# Patient Record
Sex: Female | Born: 1945 | Race: White | Hispanic: No | Smoking: Never smoker
Health system: Southern US, Community
[De-identification: ages and names within clinical notes are randomized; demographics above are authoritative.]

## PROBLEM LIST (undated history)

## (undated) DIAGNOSIS — A809 Acute poliomyelitis, unspecified: Secondary | ICD-10-CM

## (undated) DIAGNOSIS — N189 Chronic kidney disease, unspecified: Secondary | ICD-10-CM

## (undated) DIAGNOSIS — E785 Hyperlipidemia, unspecified: Secondary | ICD-10-CM

## (undated) DIAGNOSIS — H269 Unspecified cataract: Secondary | ICD-10-CM

## (undated) DIAGNOSIS — T7840XA Allergy, unspecified, initial encounter: Secondary | ICD-10-CM

## (undated) DIAGNOSIS — Z87898 Personal history of other specified conditions: Secondary | ICD-10-CM

## (undated) DIAGNOSIS — F419 Anxiety disorder, unspecified: Secondary | ICD-10-CM

## (undated) DIAGNOSIS — M810 Age-related osteoporosis without current pathological fracture: Secondary | ICD-10-CM

## (undated) DIAGNOSIS — I10 Essential (primary) hypertension: Secondary | ICD-10-CM

## (undated) DIAGNOSIS — J45909 Unspecified asthma, uncomplicated: Secondary | ICD-10-CM

## (undated) HISTORY — DX: Unspecified asthma, uncomplicated: J45.909

## (undated) HISTORY — DX: Age-related osteoporosis without current pathological fracture: M81.0

## (undated) HISTORY — DX: Hyperlipidemia, unspecified: E78.5

## (undated) HISTORY — DX: Anxiety disorder, unspecified: F41.9

## (undated) HISTORY — DX: Essential (primary) hypertension: I10

## (undated) HISTORY — DX: Acute poliomyelitis, unspecified: A80.9

## (undated) HISTORY — PX: BREAST SURGERY: SHX581

## (undated) HISTORY — DX: Chronic kidney disease, unspecified: N18.9

## (undated) HISTORY — DX: Personal history of other specified conditions: Z87.898

## (undated) HISTORY — PX: COLONOSCOPY: SHX174

## (undated) HISTORY — PX: DILATION AND CURETTAGE OF UTERUS: SHX78

## (undated) HISTORY — PX: POLYPECTOMY: SHX149

## (undated) HISTORY — PX: BREAST EXCISIONAL BIOPSY: SUR124

## (undated) HISTORY — DX: Allergy, unspecified, initial encounter: T78.40XA

## (undated) HISTORY — PX: CATARACT EXTRACTION, BILATERAL: SHX1313

## (undated) HISTORY — PX: CERVICAL BIOPSY: SHX590

---
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
## 1989-02-20 HISTORY — PX: OTHER SURGICAL HISTORY: SHX169

## 2007-10-25 ENCOUNTER — Ambulatory Visit: Payer: Self-pay | Admitting: Gastroenterology

## 2007-11-08 ENCOUNTER — Ambulatory Visit: Payer: Self-pay | Admitting: Gastroenterology

## 2007-11-08 ENCOUNTER — Encounter: Payer: Self-pay | Admitting: Gastroenterology

## 2007-11-12 ENCOUNTER — Encounter: Payer: Self-pay | Admitting: Gastroenterology

## 2008-08-05 ENCOUNTER — Encounter: Admission: RE | Admit: 2008-08-05 | Discharge: 2008-08-05 | Payer: Self-pay | Admitting: Family Medicine

## 2009-08-27 ENCOUNTER — Encounter: Admission: RE | Admit: 2009-08-27 | Discharge: 2009-08-27 | Payer: Self-pay | Admitting: Family Medicine

## 2010-09-05 ENCOUNTER — Other Ambulatory Visit: Payer: Self-pay | Admitting: Family Medicine

## 2010-09-05 DIAGNOSIS — Z1231 Encounter for screening mammogram for malignant neoplasm of breast: Secondary | ICD-10-CM

## 2010-09-15 ENCOUNTER — Ambulatory Visit
Admission: RE | Admit: 2010-09-15 | Discharge: 2010-09-15 | Disposition: A | Discharge status: Home / Self Care | Payer: Medicare Other | Source: Ambulatory Visit | Attending: Family Medicine | Admitting: Family Medicine

## 2010-09-15 DIAGNOSIS — Z1231 Encounter for screening mammogram for malignant neoplasm of breast: Secondary | ICD-10-CM

## 2011-06-01 DIAGNOSIS — E785 Hyperlipidemia, unspecified: Secondary | ICD-10-CM | POA: Diagnosis not present

## 2011-06-01 DIAGNOSIS — I10 Essential (primary) hypertension: Secondary | ICD-10-CM | POA: Diagnosis not present

## 2011-06-01 DIAGNOSIS — Z79899 Other long term (current) drug therapy: Secondary | ICD-10-CM | POA: Diagnosis not present

## 2011-09-20 ENCOUNTER — Other Ambulatory Visit: Payer: Self-pay | Admitting: Family Medicine

## 2011-09-20 DIAGNOSIS — Z1231 Encounter for screening mammogram for malignant neoplasm of breast: Secondary | ICD-10-CM

## 2011-10-09 ENCOUNTER — Ambulatory Visit
Admission: RE | Admit: 2011-10-09 | Discharge: 2011-10-09 | Disposition: A | Discharge status: Home / Self Care | Payer: Medicare Other | Source: Ambulatory Visit | Attending: Family Medicine | Admitting: Family Medicine

## 2011-10-09 DIAGNOSIS — Z1231 Encounter for screening mammogram for malignant neoplasm of breast: Secondary | ICD-10-CM | POA: Diagnosis not present

## 2011-10-11 ENCOUNTER — Ambulatory Visit: Payer: Medicare Other

## 2011-11-14 DIAGNOSIS — H5210 Myopia, unspecified eye: Secondary | ICD-10-CM | POA: Diagnosis not present

## 2011-11-14 DIAGNOSIS — H43819 Vitreous degeneration, unspecified eye: Secondary | ICD-10-CM | POA: Diagnosis not present

## 2011-12-04 DIAGNOSIS — Z79899 Other long term (current) drug therapy: Secondary | ICD-10-CM | POA: Diagnosis not present

## 2011-12-04 DIAGNOSIS — E785 Hyperlipidemia, unspecified: Secondary | ICD-10-CM | POA: Diagnosis not present

## 2011-12-04 DIAGNOSIS — J3089 Other allergic rhinitis: Secondary | ICD-10-CM | POA: Diagnosis not present

## 2011-12-04 DIAGNOSIS — I10 Essential (primary) hypertension: Secondary | ICD-10-CM | POA: Diagnosis not present

## 2011-12-04 DIAGNOSIS — J45909 Unspecified asthma, uncomplicated: Secondary | ICD-10-CM | POA: Diagnosis not present

## 2011-12-05 DIAGNOSIS — Z23 Encounter for immunization: Secondary | ICD-10-CM | POA: Diagnosis not present

## 2012-01-22 DIAGNOSIS — E785 Hyperlipidemia, unspecified: Secondary | ICD-10-CM | POA: Diagnosis not present

## 2012-08-15 DIAGNOSIS — Z79899 Other long term (current) drug therapy: Secondary | ICD-10-CM | POA: Diagnosis not present

## 2012-08-15 DIAGNOSIS — I10 Essential (primary) hypertension: Secondary | ICD-10-CM | POA: Diagnosis not present

## 2012-08-15 DIAGNOSIS — J3089 Other allergic rhinitis: Secondary | ICD-10-CM | POA: Diagnosis not present

## 2012-08-15 DIAGNOSIS — E785 Hyperlipidemia, unspecified: Secondary | ICD-10-CM | POA: Diagnosis not present

## 2012-08-15 DIAGNOSIS — J45909 Unspecified asthma, uncomplicated: Secondary | ICD-10-CM | POA: Diagnosis not present

## 2012-09-03 ENCOUNTER — Encounter: Payer: Self-pay | Admitting: Gastroenterology

## 2012-10-17 ENCOUNTER — Other Ambulatory Visit: Payer: Self-pay

## 2012-10-17 DIAGNOSIS — Z1231 Encounter for screening mammogram for malignant neoplasm of breast: Secondary | ICD-10-CM

## 2012-10-24 ENCOUNTER — Ambulatory Visit: Payer: Medicare Other

## 2012-11-15 ENCOUNTER — Ambulatory Visit
Admission: RE | Admit: 2012-11-15 | Discharge: 2012-11-15 | Disposition: A | Discharge status: Home / Self Care | Payer: Medicare Other | Source: Ambulatory Visit

## 2012-11-15 DIAGNOSIS — Z1231 Encounter for screening mammogram for malignant neoplasm of breast: Secondary | ICD-10-CM

## 2012-12-06 DIAGNOSIS — F329 Major depressive disorder, single episode, unspecified: Secondary | ICD-10-CM | POA: Diagnosis not present

## 2012-12-06 DIAGNOSIS — J309 Allergic rhinitis, unspecified: Secondary | ICD-10-CM | POA: Diagnosis not present

## 2012-12-06 DIAGNOSIS — Z23 Encounter for immunization: Secondary | ICD-10-CM | POA: Diagnosis not present

## 2012-12-06 DIAGNOSIS — E7800 Pure hypercholesterolemia, unspecified: Secondary | ICD-10-CM | POA: Diagnosis not present

## 2012-12-06 DIAGNOSIS — I10 Essential (primary) hypertension: Secondary | ICD-10-CM | POA: Diagnosis not present

## 2012-12-06 DIAGNOSIS — M1990 Unspecified osteoarthritis, unspecified site: Secondary | ICD-10-CM | POA: Diagnosis not present

## 2012-12-11 DIAGNOSIS — E7800 Pure hypercholesterolemia, unspecified: Secondary | ICD-10-CM | POA: Diagnosis not present

## 2012-12-11 DIAGNOSIS — I10 Essential (primary) hypertension: Secondary | ICD-10-CM | POA: Diagnosis not present

## 2013-01-09 DIAGNOSIS — Z23 Encounter for immunization: Secondary | ICD-10-CM | POA: Diagnosis not present

## 2013-01-09 DIAGNOSIS — Z1289 Encounter for screening for malignant neoplasm of other sites: Secondary | ICD-10-CM | POA: Diagnosis not present

## 2013-01-09 DIAGNOSIS — Z0000 Encounter for general adult medical examination without abnormal findings: Secondary | ICD-10-CM | POA: Diagnosis not present

## 2013-01-09 DIAGNOSIS — Z124 Encounter for screening for malignant neoplasm of cervix: Secondary | ICD-10-CM | POA: Diagnosis not present

## 2013-03-21 DIAGNOSIS — E7800 Pure hypercholesterolemia, unspecified: Secondary | ICD-10-CM | POA: Diagnosis not present

## 2013-03-21 DIAGNOSIS — I10 Essential (primary) hypertension: Secondary | ICD-10-CM | POA: Diagnosis not present

## 2013-03-21 DIAGNOSIS — M1990 Unspecified osteoarthritis, unspecified site: Secondary | ICD-10-CM | POA: Diagnosis not present

## 2013-03-21 DIAGNOSIS — Z13820 Encounter for screening for osteoporosis: Secondary | ICD-10-CM | POA: Diagnosis not present

## 2013-03-21 DIAGNOSIS — Z79899 Other long term (current) drug therapy: Secondary | ICD-10-CM | POA: Diagnosis not present

## 2013-03-31 DIAGNOSIS — M949 Disorder of cartilage, unspecified: Secondary | ICD-10-CM | POA: Diagnosis not present

## 2013-03-31 DIAGNOSIS — Z13820 Encounter for screening for osteoporosis: Secondary | ICD-10-CM | POA: Diagnosis not present

## 2013-03-31 DIAGNOSIS — M899 Disorder of bone, unspecified: Secondary | ICD-10-CM | POA: Diagnosis not present

## 2013-06-10 ENCOUNTER — Encounter: Payer: Self-pay | Admitting: Gastroenterology

## 2013-06-27 DIAGNOSIS — M1990 Unspecified osteoarthritis, unspecified site: Secondary | ICD-10-CM | POA: Diagnosis not present

## 2013-06-27 DIAGNOSIS — J309 Allergic rhinitis, unspecified: Secondary | ICD-10-CM | POA: Diagnosis not present

## 2013-06-27 DIAGNOSIS — I10 Essential (primary) hypertension: Secondary | ICD-10-CM | POA: Diagnosis not present

## 2013-06-27 DIAGNOSIS — H6120 Impacted cerumen, unspecified ear: Secondary | ICD-10-CM | POA: Diagnosis not present

## 2013-06-27 DIAGNOSIS — E7800 Pure hypercholesterolemia, unspecified: Secondary | ICD-10-CM | POA: Diagnosis not present

## 2013-08-04 ENCOUNTER — Telehealth: Payer: Self-pay | Admitting: Gastroenterology

## 2013-08-04 DIAGNOSIS — Z1211 Encounter for screening for malignant neoplasm of colon: Secondary | ICD-10-CM

## 2013-08-04 NOTE — Telephone Encounter (Signed)
Spoke with patient and she had Moviprep last time and would like this again. Scheduled with Dr. Hilarie Fredrickson on 10/13/13 at 11:00 AM and previsit on 10/03/13 at 9:00 AM.

## 2013-10-03 ENCOUNTER — Ambulatory Visit (AMBULATORY_SURGERY_CENTER): Payer: Self-pay

## 2013-10-03 VITALS — Ht 64.0 in | Wt 131.0 lb

## 2013-10-03 DIAGNOSIS — Z86010 Personal history of colonic polyps: Secondary | ICD-10-CM

## 2013-10-03 MED ORDER — MOVIPREP 100 G PO SOLR: ORAL | Status: DC

## 2013-10-03 NOTE — Progress Notes (Signed)
Per pt, no allergies to soy or egg products.Pt not taking any weight loss meds or using  O2 at home.     Per pt ",hard to wake up past some sedations!Michelle Whitaker"

## 2013-10-08 DIAGNOSIS — R7301 Impaired fasting glucose: Secondary | ICD-10-CM | POA: Diagnosis not present

## 2013-10-08 DIAGNOSIS — M1990 Unspecified osteoarthritis, unspecified site: Secondary | ICD-10-CM | POA: Diagnosis not present

## 2013-10-08 DIAGNOSIS — J309 Allergic rhinitis, unspecified: Secondary | ICD-10-CM | POA: Diagnosis not present

## 2013-10-08 DIAGNOSIS — Z23 Encounter for immunization: Secondary | ICD-10-CM | POA: Diagnosis not present

## 2013-10-08 DIAGNOSIS — I10 Essential (primary) hypertension: Secondary | ICD-10-CM | POA: Diagnosis not present

## 2013-10-08 DIAGNOSIS — E7800 Pure hypercholesterolemia, unspecified: Secondary | ICD-10-CM | POA: Diagnosis not present

## 2013-10-10 ENCOUNTER — Telehealth: Payer: Self-pay | Admitting: *Deleted

## 2013-10-10 NOTE — Telephone Encounter (Signed)
Patient requesting moderate sedation instead of Propofol, states she is very sensitive to medications and has asthma, and she did well with moderate last time with Dr. Sharlett Iles. Further conversation with her and she explains she is just nervous, lost her husband last year and doesn't do well with procedures. I spoke with her extensively about Propofol and Versed/Fentanyl. Also spoke with J. Monday, CRNA regarding patient concerns. Patient a bit more reassured but also informed her that she would get the opportunity to discuss her concerns and her wishes with both the CRNA and Dr. Hilarie Fredrickson Monday prior to her procedure.

## 2013-10-10 NOTE — Telephone Encounter (Signed)
Patient calls and phone call was transferred directly to admitting in [facility].  She is requesting Fentanyl and Versed not Propoful because she states that she has Asthma and has never been put to sleep with any procedures.  I gave call to Margaretmary Eddy, RN. and she spoke with patient regarding this.

## 2013-10-13 ENCOUNTER — Other Ambulatory Visit: Payer: Self-pay | Admitting: Internal Medicine

## 2013-10-13 ENCOUNTER — Ambulatory Visit (AMBULATORY_SURGERY_CENTER): Payer: Medicare Other | Admitting: Internal Medicine

## 2013-10-13 ENCOUNTER — Encounter: Payer: Self-pay | Admitting: Internal Medicine

## 2013-10-13 VITALS — BP 148/83 | HR 66 | Temp 98.3°F | Resp 16 | Ht 64.0 in | Wt 131.0 lb

## 2013-10-13 DIAGNOSIS — R002 Palpitations: Secondary | ICD-10-CM | POA: Diagnosis not present

## 2013-10-13 DIAGNOSIS — Z86010 Personal history of colonic polyps: Secondary | ICD-10-CM | POA: Diagnosis not present

## 2013-10-13 DIAGNOSIS — I10 Essential (primary) hypertension: Secondary | ICD-10-CM | POA: Diagnosis not present

## 2013-10-13 DIAGNOSIS — J45909 Unspecified asthma, uncomplicated: Secondary | ICD-10-CM | POA: Diagnosis not present

## 2013-10-13 DIAGNOSIS — D126 Benign neoplasm of colon, unspecified: Secondary | ICD-10-CM

## 2013-10-13 MED ORDER — SODIUM CHLORIDE 0.9 % IV SOLN: 500.0000 mL | INTRAVENOUS | Status: DC

## 2013-10-13 NOTE — Patient Instructions (Signed)
YOU HAD AN ENDOSCOPIC PROCEDURE TODAY AT THE [facility]: Refer to the procedure report that was given to you for any specific questions about what was found during the examination.  If the procedure report does not answer your questions, please call your gastroenterologist to clarify.  If you requested that your care partner not be given the details of your procedure findings, then the procedure report has been included in a sealed envelope for you to review at your convenience later.  YOU SHOULD EXPECT: Some feelings of bloating in the abdomen. Passage of more gas than usual.  Walking can help get rid of the air that was put into your GI tract during the procedure and reduce the bloating. If you had a lower endoscopy (such as a colonoscopy or flexible sigmoidoscopy) you may notice spotting of blood in your stool or on the toilet paper. If you underwent a bowel prep for your procedure, then you may not have a normal bowel movement for a few days.  DIET: Your first meal following the procedure should be a light meal and then it is ok to progress to your normal diet.  A half-sandwich or bowl of soup is an example of a good first meal.  Heavy or fried foods are harder to digest and may make you feel nauseous or bloated.  Likewise meals heavy in dairy and vegetables can cause extra gas to form and this can also increase the bloating.  Drink plenty of fluids but you should avoid alcoholic beverages for 24 hours.  ACTIVITY: Your care partner should take you home directly after the procedure.  You should plan to take it easy, moving slowly for the rest of the day.  You can resume normal activity the day after the procedure however you should NOT DRIVE or use heavy machinery for 24 hours (because of the sedation medicines used during the test).    SYMPTOMS TO REPORT IMMEDIATELY: A gastroenterologist can be reached at any hour.  During normal business hours, 8:30 AM to 5:00 PM Monday through Friday,  call [phone].  After hours and on weekends, please call the GI answering service at [phone] who will take a message and have the physician on call contact you.   Following lower endoscopy (colonoscopy or flexible sigmoidoscopy):  Excessive amounts of blood in the stool  Significant tenderness or worsening of abdominal pains  Swelling of the abdomen that is new, acute  Fever of 100F or higher    FOLLOW UP: If any biopsies were taken you will be contacted by phone or by letter within the next 1-3 weeks.  Call your gastroenterologist if you have not heard about the biopsies in 3 weeks.  Our staff will call the home number listed on your records the next business day following your procedure to check on you and address any questions or concerns that you may have at that time regarding the information given to you following your procedure. This is a courtesy call and so if there is no answer at the home number and we have not heard from you through the emergency physician on call, we will assume that you have returned to your regular daily activities without incident.  SIGNATURES/CONFIDENTIALITY: You and/or your care partner have signed paperwork which will be entered into your electronic medical record.  These signatures attest to the fact that that the information above on your After Visit Summary has been reviewed and is understood.  Full responsibility of the confidentiality   of this discharge information lies with you and/or your care-partner.  Polyp, diverticulosis, and high fiber diet information given. 

## 2013-10-13 NOTE — Op Note (Signed)
[place]  [address] [place], [address]   COLONOSCOPY PROCEDURE REPORT  PATIENT: Michelle, Whitaker  MR#: [id number] BIRTHDATE: [birth year] , 55  yrs. old GENDER: Female ENDOSCOPIST: Jerene Bears, MD PROCEDURE DATE:  10/13/2013 PROCEDURE:   Colonoscopy with snare polypectomy First Screening Colonoscopy - Avg.  risk and is 50 yrs.  old or older - No.  Prior Negative Screening - Now for repeat screening. N/A  History of Adenoma - Now for follow-up colonoscopy & has been > or = to 3 yrs.  Yes hx of adenoma.  Has been 3 or more years since last colonoscopy.  Polyps Removed Today? Yes. ASA CLASS:   Class II INDICATIONS:elevated risk screening, Patient's personal history of adenomatous colon polyps, and Last colonoscopy performed 2009 Sharlett Iles). MEDICATIONS: MAC sedation, administered by CRNA and propofol (Diprivan) 230mg  IV  DESCRIPTION OF PROCEDURE:   After the risks benefits and alternatives of the procedure were thoroughly explained, informed consent was obtained.  A digital rectal exam revealed no rectal mass.   The LB [id number]-[id number]  endoscope was introduced through the anus and advanced to the cecum, which was identified by both the appendix and ileocecal valve. No adverse events experienced. The quality of the prep was good, using MoviPrep  The instrument was then slowly withdrawn as the colon was fully examined.   COLON FINDINGS: A sessile polyp measuring 6 mm in size was found in the sigmoid colon.  A polypectomy was performed with a cold snare. The resection was complete and the polyp tissue was completely retrieved.   There was mild scattered diverticulosis noted in the sigmoid colon.  Retroflexed views revealed internal hemorrhoids. The time to cecum=4 minutes 50 seconds.  Withdrawal time=9 minutes 30 seconds.  The scope was withdrawn and the procedure completed. COMPLICATIONS: There were no complications.  ENDOSCOPIC IMPRESSION: 1.   Sessile  polyp measuring 6 mm in size was found in the sigmoid colon; polypectomy was performed with a cold snare 2.   There was mild diverticulosis noted in the sigmoid colon  RECOMMENDATIONS: 1.  Await pathology results 2.  If the polyp removed today is proven to be an adenomatous (pre-cancerous) polyp, you will need a repeat colonoscopy in 5 years.  Otherwise you should continue to follow colorectal cancer screening guidelines for "routine risk" patients with colonoscopy in 10 years.  You will receive a letter within 1-2 weeks with the results of your biopsy as well as final recommendations.  Please call my office if you have not received a letter after 3 weeks.   eSigned:  Jerene Bears, MD 10/13/2013 11:19 AM  cc: The Patient; [name], MD

## 2013-10-13 NOTE — Progress Notes (Signed)
Called to room to assist during endoscopic procedure.  Patient ID and intended procedure confirmed with present staff. Received instructions for my participation in the procedure from the performing physician.  

## 2013-10-13 NOTE — Progress Notes (Signed)
Report to PACU, RN, vss, BBS= Clear.  

## 2013-10-14 ENCOUNTER — Telehealth: Payer: Self-pay | Admitting: *Deleted

## 2013-10-14 NOTE — Telephone Encounter (Signed)
  Follow up Call-  Call back number 10/13/2013  Post procedure Call Back phone  # [phone]  Permission to leave phone message Yes     Patient questions:  Do you have a fever, pain , or abdominal swelling? No. Pain Score  0 *  Have you tolerated food without any problems? Yes.    Have you been able to return to your normal activities? Yes.    Do you have any questions about your discharge instructions: Diet   No. Medications  No. Follow up visit  No.  Do you have questions or concerns about your Care? No.  Actions: * If pain score is 4 or above: No action needed, pain <4.  Pt. Stated Thank Everyone for taking good care of me.

## 2013-10-20 ENCOUNTER — Encounter: Payer: Self-pay | Admitting: Internal Medicine

## 2013-11-20 DIAGNOSIS — Z23 Encounter for immunization: Secondary | ICD-10-CM | POA: Diagnosis not present

## 2013-11-27 ENCOUNTER — Other Ambulatory Visit: Payer: Self-pay

## 2013-11-27 DIAGNOSIS — Z1231 Encounter for screening mammogram for malignant neoplasm of breast: Secondary | ICD-10-CM

## 2013-12-19 ENCOUNTER — Ambulatory Visit: Payer: Medicare Other

## 2013-12-19 ENCOUNTER — Ambulatory Visit
Admission: RE | Admit: 2013-12-19 | Discharge: 2013-12-19 | Disposition: A | Discharge status: Home / Self Care | Payer: Medicare Other | Source: Ambulatory Visit

## 2013-12-19 DIAGNOSIS — Z1231 Encounter for screening mammogram for malignant neoplasm of breast: Secondary | ICD-10-CM

## 2014-01-20 DIAGNOSIS — E780 Pure hypercholesterolemia: Secondary | ICD-10-CM | POA: Diagnosis not present

## 2014-01-20 DIAGNOSIS — J301 Allergic rhinitis due to pollen: Secondary | ICD-10-CM | POA: Diagnosis not present

## 2014-01-20 DIAGNOSIS — M150 Primary generalized (osteo)arthritis: Secondary | ICD-10-CM | POA: Diagnosis not present

## 2014-01-20 DIAGNOSIS — R7301 Impaired fasting glucose: Secondary | ICD-10-CM | POA: Diagnosis not present

## 2014-01-20 DIAGNOSIS — I10 Essential (primary) hypertension: Secondary | ICD-10-CM | POA: Diagnosis not present

## 2014-01-21 DIAGNOSIS — Z23 Encounter for immunization: Secondary | ICD-10-CM | POA: Diagnosis not present

## 2014-02-03 DIAGNOSIS — H524 Presbyopia: Secondary | ICD-10-CM | POA: Diagnosis not present

## 2014-02-03 DIAGNOSIS — H268 Other specified cataract: Secondary | ICD-10-CM | POA: Diagnosis not present

## 2014-02-18 DIAGNOSIS — I10 Essential (primary) hypertension: Secondary | ICD-10-CM | POA: Diagnosis not present

## 2014-02-18 DIAGNOSIS — R002 Palpitations: Secondary | ICD-10-CM | POA: Diagnosis not present

## 2014-04-29 DIAGNOSIS — I10 Essential (primary) hypertension: Secondary | ICD-10-CM | POA: Diagnosis not present

## 2014-04-29 DIAGNOSIS — E780 Pure hypercholesterolemia: Secondary | ICD-10-CM | POA: Diagnosis not present

## 2014-04-29 DIAGNOSIS — R7301 Impaired fasting glucose: Secondary | ICD-10-CM | POA: Diagnosis not present

## 2014-04-29 DIAGNOSIS — J301 Allergic rhinitis due to pollen: Secondary | ICD-10-CM | POA: Diagnosis not present

## 2014-04-29 DIAGNOSIS — M150 Primary generalized (osteo)arthritis: Secondary | ICD-10-CM | POA: Diagnosis not present

## 2014-08-06 DIAGNOSIS — R319 Hematuria, unspecified: Secondary | ICD-10-CM | POA: Diagnosis not present

## 2014-08-06 DIAGNOSIS — Z23 Encounter for immunization: Secondary | ICD-10-CM | POA: Diagnosis not present

## 2014-08-06 DIAGNOSIS — Z0000 Encounter for general adult medical examination without abnormal findings: Secondary | ICD-10-CM | POA: Diagnosis not present

## 2014-08-06 DIAGNOSIS — Z1211 Encounter for screening for malignant neoplasm of colon: Secondary | ICD-10-CM | POA: Diagnosis not present

## 2014-08-06 DIAGNOSIS — E780 Pure hypercholesterolemia: Secondary | ICD-10-CM | POA: Diagnosis not present

## 2014-08-06 DIAGNOSIS — R7301 Impaired fasting glucose: Secondary | ICD-10-CM | POA: Diagnosis not present

## 2014-10-22 DIAGNOSIS — L255 Unspecified contact dermatitis due to plants, except food: Secondary | ICD-10-CM | POA: Diagnosis not present

## 2014-11-11 DIAGNOSIS — I10 Essential (primary) hypertension: Secondary | ICD-10-CM | POA: Diagnosis not present

## 2014-11-11 DIAGNOSIS — R7301 Impaired fasting glucose: Secondary | ICD-10-CM | POA: Diagnosis not present

## 2014-11-11 DIAGNOSIS — E780 Pure hypercholesterolemia: Secondary | ICD-10-CM | POA: Diagnosis not present

## 2014-11-16 DIAGNOSIS — J301 Allergic rhinitis due to pollen: Secondary | ICD-10-CM | POA: Diagnosis not present

## 2014-11-16 DIAGNOSIS — M150 Primary generalized (osteo)arthritis: Secondary | ICD-10-CM | POA: Diagnosis not present

## 2014-11-16 DIAGNOSIS — R7301 Impaired fasting glucose: Secondary | ICD-10-CM | POA: Diagnosis not present

## 2014-11-16 DIAGNOSIS — E780 Pure hypercholesterolemia: Secondary | ICD-10-CM | POA: Diagnosis not present

## 2014-11-16 DIAGNOSIS — I10 Essential (primary) hypertension: Secondary | ICD-10-CM | POA: Diagnosis not present

## 2014-11-16 DIAGNOSIS — Z23 Encounter for immunization: Secondary | ICD-10-CM | POA: Diagnosis not present

## 2014-12-08 ENCOUNTER — Other Ambulatory Visit: Payer: Self-pay

## 2014-12-08 DIAGNOSIS — Z1231 Encounter for screening mammogram for malignant neoplasm of breast: Secondary | ICD-10-CM

## 2014-12-25 ENCOUNTER — Ambulatory Visit
Admission: RE | Admit: 2014-12-25 | Discharge: 2014-12-25 | Disposition: A | Discharge status: Home / Self Care | Payer: Medicare Other | Source: Ambulatory Visit

## 2014-12-25 DIAGNOSIS — Z1231 Encounter for screening mammogram for malignant neoplasm of breast: Secondary | ICD-10-CM | POA: Diagnosis not present

## 2015-02-03 DIAGNOSIS — H25013 Cortical age-related cataract, bilateral: Secondary | ICD-10-CM | POA: Diagnosis not present

## 2015-02-03 DIAGNOSIS — H5213 Myopia, bilateral: Secondary | ICD-10-CM | POA: Diagnosis not present

## 2015-02-16 DIAGNOSIS — I10 Essential (primary) hypertension: Secondary | ICD-10-CM | POA: Diagnosis not present

## 2015-02-16 DIAGNOSIS — R7301 Impaired fasting glucose: Secondary | ICD-10-CM | POA: Diagnosis not present

## 2015-02-16 DIAGNOSIS — E782 Mixed hyperlipidemia: Secondary | ICD-10-CM | POA: Diagnosis not present

## 2015-02-16 DIAGNOSIS — Z79899 Other long term (current) drug therapy: Secondary | ICD-10-CM | POA: Diagnosis not present

## 2015-02-17 DIAGNOSIS — R7301 Impaired fasting glucose: Secondary | ICD-10-CM | POA: Diagnosis not present

## 2015-02-17 DIAGNOSIS — I10 Essential (primary) hypertension: Secondary | ICD-10-CM | POA: Diagnosis not present

## 2015-02-17 DIAGNOSIS — E782 Mixed hyperlipidemia: Secondary | ICD-10-CM | POA: Diagnosis not present

## 2015-05-17 DIAGNOSIS — J0101 Acute recurrent maxillary sinusitis: Secondary | ICD-10-CM | POA: Diagnosis not present

## 2015-08-17 DIAGNOSIS — E782 Mixed hyperlipidemia: Secondary | ICD-10-CM | POA: Diagnosis not present

## 2015-08-17 DIAGNOSIS — R7301 Impaired fasting glucose: Secondary | ICD-10-CM | POA: Diagnosis not present

## 2015-08-17 DIAGNOSIS — I10 Essential (primary) hypertension: Secondary | ICD-10-CM | POA: Diagnosis not present

## 2015-08-19 DIAGNOSIS — I10 Essential (primary) hypertension: Secondary | ICD-10-CM | POA: Diagnosis not present

## 2015-08-19 DIAGNOSIS — E782 Mixed hyperlipidemia: Secondary | ICD-10-CM | POA: Diagnosis not present

## 2015-08-19 DIAGNOSIS — R7301 Impaired fasting glucose: Secondary | ICD-10-CM | POA: Diagnosis not present

## 2015-10-18 ENCOUNTER — Other Ambulatory Visit: Payer: Self-pay

## 2015-11-16 DIAGNOSIS — Z0000 Encounter for general adult medical examination without abnormal findings: Secondary | ICD-10-CM | POA: Diagnosis not present

## 2015-11-16 DIAGNOSIS — Z6823 Body mass index (BMI) 23.0-23.9, adult: Secondary | ICD-10-CM | POA: Diagnosis not present

## 2015-11-16 DIAGNOSIS — N951 Menopausal and female climacteric states: Secondary | ICD-10-CM | POA: Diagnosis not present

## 2015-11-16 DIAGNOSIS — Z23 Encounter for immunization: Secondary | ICD-10-CM | POA: Diagnosis not present

## 2015-11-16 DIAGNOSIS — Z1211 Encounter for screening for malignant neoplasm of colon: Secondary | ICD-10-CM | POA: Diagnosis not present

## 2015-12-02 ENCOUNTER — Other Ambulatory Visit: Payer: Self-pay | Admitting: Family Medicine

## 2015-12-02 DIAGNOSIS — Z1231 Encounter for screening mammogram for malignant neoplasm of breast: Secondary | ICD-10-CM

## 2015-12-08 DIAGNOSIS — M810 Age-related osteoporosis without current pathological fracture: Secondary | ICD-10-CM | POA: Diagnosis not present

## 2015-12-08 DIAGNOSIS — N959 Unspecified menopausal and perimenopausal disorder: Secondary | ICD-10-CM | POA: Diagnosis not present

## 2016-01-03 ENCOUNTER — Ambulatory Visit
Admission: RE | Admit: 2016-01-03 | Discharge: 2016-01-03 | Disposition: A | Discharge status: Home / Self Care | Payer: Medicare Other | Source: Ambulatory Visit | Attending: Family Medicine | Admitting: Family Medicine

## 2016-01-03 DIAGNOSIS — Z1231 Encounter for screening mammogram for malignant neoplasm of breast: Secondary | ICD-10-CM

## 2016-02-01 DIAGNOSIS — H2513 Age-related nuclear cataract, bilateral: Secondary | ICD-10-CM | POA: Diagnosis not present

## 2016-02-01 DIAGNOSIS — H5213 Myopia, bilateral: Secondary | ICD-10-CM | POA: Diagnosis not present

## 2016-02-18 DIAGNOSIS — R7301 Impaired fasting glucose: Secondary | ICD-10-CM | POA: Diagnosis not present

## 2016-02-18 DIAGNOSIS — Z79899 Other long term (current) drug therapy: Secondary | ICD-10-CM | POA: Diagnosis not present

## 2016-02-21 DIAGNOSIS — H269 Unspecified cataract: Secondary | ICD-10-CM

## 2016-02-21 HISTORY — DX: Unspecified cataract: H26.9

## 2016-02-22 DIAGNOSIS — I10 Essential (primary) hypertension: Secondary | ICD-10-CM | POA: Diagnosis not present

## 2016-02-22 DIAGNOSIS — E782 Mixed hyperlipidemia: Secondary | ICD-10-CM | POA: Diagnosis not present

## 2016-02-22 DIAGNOSIS — R7301 Impaired fasting glucose: Secondary | ICD-10-CM | POA: Diagnosis not present

## 2016-08-28 DIAGNOSIS — E782 Mixed hyperlipidemia: Secondary | ICD-10-CM | POA: Diagnosis not present

## 2016-08-28 DIAGNOSIS — I10 Essential (primary) hypertension: Secondary | ICD-10-CM | POA: Diagnosis not present

## 2016-08-28 DIAGNOSIS — R7301 Impaired fasting glucose: Secondary | ICD-10-CM | POA: Diagnosis not present

## 2016-08-30 DIAGNOSIS — E782 Mixed hyperlipidemia: Secondary | ICD-10-CM | POA: Diagnosis not present

## 2016-08-30 DIAGNOSIS — R143 Flatulence: Secondary | ICD-10-CM | POA: Diagnosis not present

## 2016-08-30 DIAGNOSIS — I10 Essential (primary) hypertension: Secondary | ICD-10-CM | POA: Diagnosis not present

## 2016-08-30 DIAGNOSIS — R1904 Left lower quadrant abdominal swelling, mass and lump: Secondary | ICD-10-CM | POA: Diagnosis not present

## 2016-08-30 DIAGNOSIS — R7301 Impaired fasting glucose: Secondary | ICD-10-CM | POA: Diagnosis not present

## 2016-09-05 DIAGNOSIS — R1900 Intra-abdominal and pelvic swelling, mass and lump, unspecified site: Secondary | ICD-10-CM | POA: Diagnosis not present

## 2016-09-05 DIAGNOSIS — R1904 Left lower quadrant abdominal swelling, mass and lump: Secondary | ICD-10-CM | POA: Diagnosis not present

## 2016-09-05 DIAGNOSIS — K7689 Other specified diseases of liver: Secondary | ICD-10-CM | POA: Diagnosis not present

## 2016-09-15 DIAGNOSIS — R1904 Left lower quadrant abdominal swelling, mass and lump: Secondary | ICD-10-CM | POA: Diagnosis not present

## 2016-09-15 DIAGNOSIS — R1032 Left lower quadrant pain: Secondary | ICD-10-CM | POA: Diagnosis not present

## 2016-10-17 DIAGNOSIS — Z01818 Encounter for other preprocedural examination: Secondary | ICD-10-CM | POA: Diagnosis not present

## 2016-10-17 DIAGNOSIS — H25811 Combined forms of age-related cataract, right eye: Secondary | ICD-10-CM | POA: Diagnosis not present

## 2016-10-25 DIAGNOSIS — R938 Abnormal findings on diagnostic imaging of other specified body structures: Secondary | ICD-10-CM | POA: Diagnosis not present

## 2016-10-26 DIAGNOSIS — H2511 Age-related nuclear cataract, right eye: Secondary | ICD-10-CM | POA: Diagnosis not present

## 2016-10-26 DIAGNOSIS — H25811 Combined forms of age-related cataract, right eye: Secondary | ICD-10-CM | POA: Diagnosis not present

## 2016-11-01 DIAGNOSIS — H2512 Age-related nuclear cataract, left eye: Secondary | ICD-10-CM | POA: Diagnosis not present

## 2016-11-01 DIAGNOSIS — H25812 Combined forms of age-related cataract, left eye: Secondary | ICD-10-CM | POA: Diagnosis not present

## 2016-11-20 DIAGNOSIS — Z23 Encounter for immunization: Secondary | ICD-10-CM | POA: Diagnosis not present

## 2016-11-20 DIAGNOSIS — Z0000 Encounter for general adult medical examination without abnormal findings: Secondary | ICD-10-CM | POA: Diagnosis not present

## 2016-11-20 DIAGNOSIS — Z6823 Body mass index (BMI) 23.0-23.9, adult: Secondary | ICD-10-CM | POA: Diagnosis not present

## 2016-12-01 ENCOUNTER — Other Ambulatory Visit: Payer: Self-pay | Admitting: Family Medicine

## 2016-12-01 DIAGNOSIS — Z1231 Encounter for screening mammogram for malignant neoplasm of breast: Secondary | ICD-10-CM

## 2016-12-27 DIAGNOSIS — J45909 Unspecified asthma, uncomplicated: Secondary | ICD-10-CM | POA: Insufficient documentation

## 2016-12-27 DIAGNOSIS — N8500 Endometrial hyperplasia, unspecified: Secondary | ICD-10-CM | POA: Diagnosis not present

## 2017-01-01 DIAGNOSIS — N8500 Endometrial hyperplasia, unspecified: Secondary | ICD-10-CM | POA: Diagnosis not present

## 2017-01-05 ENCOUNTER — Ambulatory Visit
Admission: RE | Admit: 2017-01-05 | Discharge: 2017-01-05 | Disposition: A | Discharge status: Home / Self Care | Payer: Medicare Other | Source: Ambulatory Visit | Attending: Family Medicine | Admitting: Family Medicine

## 2017-01-05 DIAGNOSIS — Z1231 Encounter for screening mammogram for malignant neoplasm of breast: Secondary | ICD-10-CM

## 2017-02-27 DIAGNOSIS — I10 Essential (primary) hypertension: Secondary | ICD-10-CM | POA: Diagnosis not present

## 2017-02-27 DIAGNOSIS — R7301 Impaired fasting glucose: Secondary | ICD-10-CM | POA: Diagnosis not present

## 2017-02-27 DIAGNOSIS — E782 Mixed hyperlipidemia: Secondary | ICD-10-CM | POA: Diagnosis not present

## 2017-03-02 DIAGNOSIS — M19041 Primary osteoarthritis, right hand: Secondary | ICD-10-CM | POA: Diagnosis not present

## 2017-03-02 DIAGNOSIS — I10 Essential (primary) hypertension: Secondary | ICD-10-CM | POA: Diagnosis not present

## 2017-03-02 DIAGNOSIS — M19042 Primary osteoarthritis, left hand: Secondary | ICD-10-CM | POA: Diagnosis not present

## 2017-03-02 DIAGNOSIS — R7301 Impaired fasting glucose: Secondary | ICD-10-CM | POA: Diagnosis not present

## 2017-03-02 DIAGNOSIS — E782 Mixed hyperlipidemia: Secondary | ICD-10-CM | POA: Diagnosis not present

## 2017-03-02 DIAGNOSIS — Z6823 Body mass index (BMI) 23.0-23.9, adult: Secondary | ICD-10-CM | POA: Diagnosis not present

## 2017-05-29 DIAGNOSIS — H26493 Other secondary cataract, bilateral: Secondary | ICD-10-CM | POA: Diagnosis not present

## 2017-07-18 DIAGNOSIS — N8500 Endometrial hyperplasia, unspecified: Secondary | ICD-10-CM | POA: Diagnosis not present

## 2017-08-28 DIAGNOSIS — R7301 Impaired fasting glucose: Secondary | ICD-10-CM | POA: Diagnosis not present

## 2017-08-28 DIAGNOSIS — I10 Essential (primary) hypertension: Secondary | ICD-10-CM | POA: Diagnosis not present

## 2017-08-28 DIAGNOSIS — E782 Mixed hyperlipidemia: Secondary | ICD-10-CM | POA: Diagnosis not present

## 2017-08-31 DIAGNOSIS — R7301 Impaired fasting glucose: Secondary | ICD-10-CM | POA: Diagnosis not present

## 2017-08-31 DIAGNOSIS — I10 Essential (primary) hypertension: Secondary | ICD-10-CM | POA: Diagnosis not present

## 2017-08-31 DIAGNOSIS — E782 Mixed hyperlipidemia: Secondary | ICD-10-CM | POA: Diagnosis not present

## 2017-08-31 DIAGNOSIS — M19042 Primary osteoarthritis, left hand: Secondary | ICD-10-CM | POA: Diagnosis not present

## 2017-08-31 DIAGNOSIS — M19041 Primary osteoarthritis, right hand: Secondary | ICD-10-CM | POA: Diagnosis not present

## 2017-08-31 DIAGNOSIS — Z6824 Body mass index (BMI) 24.0-24.9, adult: Secondary | ICD-10-CM | POA: Diagnosis not present

## 2017-08-31 DIAGNOSIS — J4520 Mild intermittent asthma, uncomplicated: Secondary | ICD-10-CM | POA: Diagnosis not present

## 2017-09-21 ENCOUNTER — Other Ambulatory Visit: Payer: Self-pay

## 2017-11-21 DIAGNOSIS — Z1231 Encounter for screening mammogram for malignant neoplasm of breast: Secondary | ICD-10-CM | POA: Diagnosis not present

## 2017-11-21 DIAGNOSIS — Z6823 Body mass index (BMI) 23.0-23.9, adult: Secondary | ICD-10-CM | POA: Diagnosis not present

## 2017-11-21 DIAGNOSIS — Z23 Encounter for immunization: Secondary | ICD-10-CM | POA: Diagnosis not present

## 2017-11-21 DIAGNOSIS — Z0000 Encounter for general adult medical examination without abnormal findings: Secondary | ICD-10-CM | POA: Diagnosis not present

## 2017-11-21 DIAGNOSIS — M810 Age-related osteoporosis without current pathological fracture: Secondary | ICD-10-CM | POA: Diagnosis not present

## 2017-12-21 ENCOUNTER — Other Ambulatory Visit: Payer: Self-pay | Admitting: Family Medicine

## 2017-12-21 DIAGNOSIS — Z1231 Encounter for screening mammogram for malignant neoplasm of breast: Secondary | ICD-10-CM

## 2018-01-04 ENCOUNTER — Other Ambulatory Visit: Payer: Self-pay

## 2018-01-04 ENCOUNTER — Other Ambulatory Visit: Payer: Self-pay | Admitting: Family Medicine

## 2018-01-04 DIAGNOSIS — R5381 Other malaise: Secondary | ICD-10-CM

## 2018-01-10 ENCOUNTER — Ambulatory Visit
Admission: RE | Admit: 2018-01-10 | Discharge: 2018-01-10 | Disposition: A | Discharge status: Home / Self Care | Payer: Medicare Other | Source: Ambulatory Visit | Attending: Family Medicine | Admitting: Family Medicine

## 2018-01-10 ENCOUNTER — Other Ambulatory Visit: Payer: Self-pay | Admitting: Family Medicine

## 2018-01-10 DIAGNOSIS — M810 Age-related osteoporosis without current pathological fracture: Secondary | ICD-10-CM

## 2018-01-10 DIAGNOSIS — Z1231 Encounter for screening mammogram for malignant neoplasm of breast: Secondary | ICD-10-CM | POA: Diagnosis not present

## 2018-03-01 DIAGNOSIS — I10 Essential (primary) hypertension: Secondary | ICD-10-CM | POA: Diagnosis not present

## 2018-03-01 DIAGNOSIS — R7301 Impaired fasting glucose: Secondary | ICD-10-CM | POA: Diagnosis not present

## 2018-03-01 DIAGNOSIS — E782 Mixed hyperlipidemia: Secondary | ICD-10-CM | POA: Diagnosis not present

## 2018-03-04 DIAGNOSIS — M810 Age-related osteoporosis without current pathological fracture: Secondary | ICD-10-CM | POA: Diagnosis not present

## 2018-03-04 DIAGNOSIS — M85852 Other specified disorders of bone density and structure, left thigh: Secondary | ICD-10-CM | POA: Diagnosis not present

## 2018-03-05 DIAGNOSIS — E782 Mixed hyperlipidemia: Secondary | ICD-10-CM | POA: Diagnosis not present

## 2018-03-05 DIAGNOSIS — I10 Essential (primary) hypertension: Secondary | ICD-10-CM | POA: Diagnosis not present

## 2018-03-05 DIAGNOSIS — M810 Age-related osteoporosis without current pathological fracture: Secondary | ICD-10-CM | POA: Diagnosis not present

## 2018-03-05 DIAGNOSIS — Z6824 Body mass index (BMI) 24.0-24.9, adult: Secondary | ICD-10-CM | POA: Diagnosis not present

## 2018-03-05 DIAGNOSIS — J4520 Mild intermittent asthma, uncomplicated: Secondary | ICD-10-CM | POA: Diagnosis not present

## 2018-03-05 DIAGNOSIS — R7301 Impaired fasting glucose: Secondary | ICD-10-CM | POA: Diagnosis not present

## 2018-08-30 DIAGNOSIS — R7301 Impaired fasting glucose: Secondary | ICD-10-CM | POA: Diagnosis not present

## 2018-08-30 DIAGNOSIS — E782 Mixed hyperlipidemia: Secondary | ICD-10-CM | POA: Diagnosis not present

## 2018-08-30 DIAGNOSIS — I10 Essential (primary) hypertension: Secondary | ICD-10-CM | POA: Diagnosis not present

## 2018-09-03 DIAGNOSIS — M810 Age-related osteoporosis without current pathological fracture: Secondary | ICD-10-CM | POA: Diagnosis not present

## 2018-09-03 DIAGNOSIS — R7301 Impaired fasting glucose: Secondary | ICD-10-CM | POA: Diagnosis not present

## 2018-09-03 DIAGNOSIS — E782 Mixed hyperlipidemia: Secondary | ICD-10-CM | POA: Diagnosis not present

## 2018-09-03 DIAGNOSIS — Z6824 Body mass index (BMI) 24.0-24.9, adult: Secondary | ICD-10-CM | POA: Diagnosis not present

## 2018-09-03 DIAGNOSIS — J4520 Mild intermittent asthma, uncomplicated: Secondary | ICD-10-CM | POA: Diagnosis not present

## 2018-09-03 DIAGNOSIS — I10 Essential (primary) hypertension: Secondary | ICD-10-CM | POA: Diagnosis not present

## 2018-09-13 DIAGNOSIS — H26493 Other secondary cataract, bilateral: Secondary | ICD-10-CM | POA: Diagnosis not present

## 2018-09-21 ENCOUNTER — Encounter: Payer: Self-pay | Admitting: Internal Medicine

## 2018-10-07 ENCOUNTER — Encounter: Payer: Self-pay | Admitting: Internal Medicine

## 2018-10-24 DIAGNOSIS — Z23 Encounter for immunization: Secondary | ICD-10-CM | POA: Diagnosis not present

## 2018-10-30 ENCOUNTER — Ambulatory Visit (AMBULATORY_SURGERY_CENTER): Payer: Self-pay | Admitting: *Deleted

## 2018-10-30 ENCOUNTER — Other Ambulatory Visit: Payer: Self-pay

## 2018-10-30 VITALS — Temp 96.9°F | Ht 63.5 in | Wt 137.0 lb

## 2018-10-30 DIAGNOSIS — Z86010 Personal history of colonic polyps: Secondary | ICD-10-CM

## 2018-10-30 MED ORDER — SUPREP BOWEL PREP KIT 17.5-3.13-1.6 GM/177ML PO SOLN: 1.0000 | Freq: Once | ORAL | 0 refills | Status: AC

## 2018-10-30 NOTE — Progress Notes (Signed)
No egg or soy allergy known to patient  No issues with past sedation with any surgeries  or procedures, no intubation problems - hard to wake post op  No diet pills per patient No home 02 use per patient  No blood thinners per patient  Pt denies issues with constipation  No A fib or A flutter  EMMI video sent to pt's e mail   2018 pt noticed firm area to left abdomen- not sore, no pain- she has had multiple tests and scans and all negative- GYN ? Lipoma-  Biopsy negative-  Pt wanted to make [place] aware

## 2018-11-12 ENCOUNTER — Telehealth: Payer: Self-pay

## 2018-11-12 NOTE — Telephone Encounter (Signed)
Covid-19 screening questions   Do you now or have you had a fever in the last 14 days?  Do you have any respiratory symptoms of shortness of breath or cough now or in the last 14 days?  Do you have any family members or close contacts with diagnosed or suspected Covid-19 in the past 14 days?  Have you been tested for Covid-19 and found to be positive?       

## 2018-11-13 ENCOUNTER — Other Ambulatory Visit: Payer: Self-pay | Admitting: Internal Medicine

## 2018-11-13 ENCOUNTER — Encounter: Payer: Self-pay | Admitting: Internal Medicine

## 2018-11-13 ENCOUNTER — Ambulatory Visit (AMBULATORY_SURGERY_CENTER): Payer: Medicare Other | Admitting: Internal Medicine

## 2018-11-13 ENCOUNTER — Other Ambulatory Visit: Payer: Self-pay

## 2018-11-13 VITALS — BP 146/79 | HR 64 | Temp 97.9°F | Resp 12 | Ht 63.0 in | Wt 137.0 lb

## 2018-11-13 DIAGNOSIS — Z86010 Personal history of colonic polyps: Secondary | ICD-10-CM

## 2018-11-13 DIAGNOSIS — D123 Benign neoplasm of transverse colon: Secondary | ICD-10-CM

## 2018-11-13 DIAGNOSIS — K621 Rectal polyp: Secondary | ICD-10-CM | POA: Diagnosis not present

## 2018-11-13 DIAGNOSIS — D128 Benign neoplasm of rectum: Secondary | ICD-10-CM

## 2018-11-13 DIAGNOSIS — D124 Benign neoplasm of descending colon: Secondary | ICD-10-CM

## 2018-11-13 DIAGNOSIS — K635 Polyp of colon: Secondary | ICD-10-CM | POA: Diagnosis not present

## 2018-11-13 MED ORDER — SODIUM CHLORIDE 0.9 % IV SOLN: 500.0000 mL | Freq: Once | INTRAVENOUS | Status: DC

## 2018-11-13 NOTE — Progress Notes (Signed)
Pt Drowsy. VSS. To PACU, report to RN. No anesthetic complications noted.  

## 2018-11-13 NOTE — Progress Notes (Signed)
Temp check by KA/vital check by CW.  Patient states no changes in medical or surgical history since pre-visit screening on 10/30/2018.

## 2018-11-13 NOTE — Progress Notes (Signed)
Called to room to assist during endoscopic procedure.  Patient ID and intended procedure confirmed with present staff. Received instructions for my participation in the procedure from the performing physician.  

## 2018-11-13 NOTE — Op Note (Signed)
[place] Patient Name: Michelle Whitaker Procedure Date: 11/13/2018 9:48 AM MRN: [id number] Endoscopist: Jerene Bears , MD Age: 73 Referring MD:  Date of Birth: 21-Apr-1945 Gender: Female Account #: [id number] Procedure:                Colonoscopy Indications:              Surveillance: Personal history of adenomatous                            polyps on last colonoscopy 5 years ago Medicines:                Monitored Anesthesia Care Procedure:                Pre-Anesthesia Assessment:                           - Prior to the procedure, a History and Physical                            was performed, and patient medications and                            allergies were reviewed. The patient's tolerance of                            previous anesthesia was also reviewed. The risks                            and benefits of the procedure and the sedation                            options and risks were discussed with the patient.                            All questions were answered, and informed consent                            was obtained. Prior Anticoagulants: The patient has                            taken no previous anticoagulant or antiplatelet                            agents. ASA Grade Assessment: II - A patient with                            mild systemic disease. After reviewing the risks                            and benefits, the patient was deemed in                            satisfactory condition to undergo the procedure.  After obtaining informed consent, the colonoscope                            was passed under direct vision. Throughout the                            procedure, the patient's blood pressure, pulse, and                            oxygen saturations were monitored continuously. The                            Colonoscope was introduced through the anus and                            advanced to the cecum, identified  by appendiceal                            orifice and ileocecal valve. The colonoscopy was                            performed without difficulty. The patient tolerated                            the procedure well. The quality of the bowel                            preparation was good. The ileocecal valve,                            appendiceal orifice, and rectum were photographed. Scope In: 9:51:53 AM Scope Out: 10:09:37 AM Scope Withdrawal Time: 0 hours 13 minutes 33 seconds  Total Procedure Duration: 0 hours 17 minutes 44 seconds  Findings:                 The digital rectal exam was normal.                           Three sessile polyps were found in the hepatic                            flexure. The polyps were 3 to 4 mm in size. These                            polyps were removed with a cold snare. Resection                            and retrieval were complete.                           A 4 mm polyp was found in the descending colon. The                            polyp was sessile.  The polyp was removed with a                            cold snare. Resection and retrieval were complete.                           A 4 mm polyp was found in the rectum. The polyp was                            sessile. The polyp was removed with a cold snare.                            Resection and retrieval were complete.                           A few small-mouthed diverticula were found in the                            sigmoid colon.                           Internal hemorrhoids were found during                            retroflexion. The hemorrhoids were small. Complications:            No immediate complications. Estimated Blood Loss:     Estimated blood loss was minimal. Impression:               - Three 3 to 4 mm polyps at the hepatic flexure,                            removed with a cold snare. Resected and retrieved.                           - One 4 mm polyp in the descending  colon, removed                            with a cold snare. Resected and retrieved.                           - One 4 mm polyp in the rectum, removed with a cold                            snare. Resected and retrieved.                           - Mild diverticulosis in the sigmoid colon.                           - Small internal hemorrhoids. Recommendation:           - Patient has a contact number available for  emergencies. The signs and symptoms of potential                            delayed complications were discussed with the                            patient. Return to normal activities tomorrow.                            Written discharge instructions were provided to the                            patient.                           - Resume previous diet.                           - Continue present medications.                           - Await pathology results.                           - Repeat colonoscopy is recommended. The                            colonoscopy date will be determined after pathology                            results from today's exam become available for                            review. Jerene Bears, MD 11/13/2018 10:16:22 AM This report has been signed electronically.

## 2018-11-13 NOTE — Patient Instructions (Signed)
Discharge instructions given. Handouts on polyps,diverticulosis and hemorrhoids. Resume previous medications. YOU HAD AN ENDOSCOPIC PROCEDURE TODAY AT THE [facility]:   Refer to the procedure report that was given to you for any specific questions about what was found during the examination.  If the procedure report does not answer your questions, please call your gastroenterologist to clarify.  If you requested that your care partner not be given the details of your procedure findings, then the procedure report has been included in a sealed envelope for you to review at your convenience later.  YOU SHOULD EXPECT: Some feelings of bloating in the abdomen. Passage of more gas than usual.  Walking can help get rid of the air that was put into your GI tract during the procedure and reduce the bloating. If you had a lower endoscopy (such as a colonoscopy or flexible sigmoidoscopy) you may notice spotting of blood in your stool or on the toilet paper. If you underwent a bowel prep for your procedure, you may not have a normal bowel movement for a few days.  Please Note:  You might notice some irritation and congestion in your nose or some drainage.  This is from the oxygen used during your procedure.  There is no need for concern and it should clear up in a day or so.  SYMPTOMS TO REPORT IMMEDIATELY:   Following lower endoscopy (colonoscopy or flexible sigmoidoscopy):  Excessive amounts of blood in the stool  Significant tenderness or worsening of abdominal pains  Swelling of the abdomen that is new, acute  Fever of 100F or higher  For urgent or emergent issues, a gastroenterologist can be reached at any hour by calling [phone].   DIET:  We do recommend a small meal at first, but then you may proceed to your regular diet.  Drink plenty of fluids but you should avoid alcoholic beverages for 24 hours.  ACTIVITY:  You should plan to take it easy for the rest of today and you  should NOT DRIVE or use heavy machinery until tomorrow (because of the sedation medicines used during the test).    FOLLOW UP: Our staff will call the number listed on your records 48-72 hours following your procedure to check on you and address any questions or concerns that you may have regarding the information given to you following your procedure. If we do not reach you, we will leave a message.  We will attempt to reach you two times.  During this call, we will ask if you have developed any symptoms of COVID 19. If you develop any symptoms (ie: fever, flu-like symptoms, shortness of breath, cough etc.) before then, please call [phone].  If you test positive for Covid 19 in the 2 weeks post procedure, please call and report this information to us.    If any biopsies were taken you will be contacted by phone or by letter within the next 1-3 weeks.  Please call us at [phone] if you have not heard about the biopsies in 3 weeks.    SIGNATURES/CONFIDENTIALITY: You and/or your care partner have signed paperwork which will be entered into your electronic medical record.  These signatures attest to the fact that that the information above on your After Visit Summary has been reviewed and is understood.  Full responsibility of the confidentiality of this discharge information lies with you and/or your care-partner. 

## 2018-11-15 ENCOUNTER — Telehealth: Payer: Self-pay

## 2018-11-15 NOTE — Telephone Encounter (Signed)
  Follow up Call-  Call back number 11/13/2018  Post procedure Call Back phone  # [phone]  Permission to leave phone message Yes  Some recent data might be hidden     Patient questions:  Do you have a fever, pain , or abdominal swelling? No. Pain Score  0 *  Have you tolerated food without any problems? Yes.    Have you been able to return to your normal activities? Yes.    Do you have any questions about your discharge instructions: Diet   No. Medications  No. Follow up visit  No.  Do you have questions or concerns about your Care? No.  Actions: * If pain score is 4 or above: No action needed, pain <4. 1. Have you developed a fever since your procedure? no  2.   Have you had an respiratory symptoms (SOB or cough) since your procedure? no  3.   Have you tested positive for COVID 19 since your procedure no  4.   Have you had any family members/close contacts diagnosed with the COVID 19 since your procedure?  no   If yes to any of these questions please route to Joylene John, RN and Alphonsa Gin, Therapist, sports.

## 2018-11-20 ENCOUNTER — Encounter: Payer: Self-pay | Admitting: Internal Medicine

## 2018-11-28 DIAGNOSIS — Z0000 Encounter for general adult medical examination without abnormal findings: Secondary | ICD-10-CM | POA: Diagnosis not present

## 2018-12-05 ENCOUNTER — Other Ambulatory Visit: Payer: Self-pay | Admitting: *Deleted

## 2018-12-05 DIAGNOSIS — Z1231 Encounter for screening mammogram for malignant neoplasm of breast: Secondary | ICD-10-CM

## 2019-01-27 ENCOUNTER — Ambulatory Visit
Admission: RE | Admit: 2019-01-27 | Discharge: 2019-01-27 | Disposition: A | Discharge status: Home / Self Care | Payer: Medicare Other | Source: Ambulatory Visit | Attending: Family Medicine | Admitting: Family Medicine

## 2019-01-27 ENCOUNTER — Other Ambulatory Visit: Payer: Self-pay

## 2019-01-27 DIAGNOSIS — Z1231 Encounter for screening mammogram for malignant neoplasm of breast: Secondary | ICD-10-CM

## 2019-07-04 ENCOUNTER — Other Ambulatory Visit: Payer: Self-pay | Admitting: Family Medicine

## 2019-07-25 ENCOUNTER — Other Ambulatory Visit: Payer: Self-pay | Admitting: Family Medicine

## 2019-08-20 ENCOUNTER — Other Ambulatory Visit: Payer: Self-pay | Admitting: Family Medicine

## 2019-09-01 ENCOUNTER — Other Ambulatory Visit: Payer: Medicare Other

## 2019-09-01 ENCOUNTER — Other Ambulatory Visit: Payer: Self-pay

## 2019-09-01 DIAGNOSIS — I10 Essential (primary) hypertension: Secondary | ICD-10-CM

## 2019-09-01 DIAGNOSIS — M810 Age-related osteoporosis without current pathological fracture: Secondary | ICD-10-CM | POA: Diagnosis not present

## 2019-09-01 DIAGNOSIS — E782 Mixed hyperlipidemia: Secondary | ICD-10-CM

## 2019-09-02 LAB — COMPREHENSIVE METABOLIC PANEL
ALT: 18 IU/L (ref 0–32)
AST: 19 IU/L (ref 0–40)
Albumin/Globulin Ratio: 2.1 (ref 1.2–2.2)
Albumin: 4.6 g/dL (ref 3.7–4.7)
Alkaline Phosphatase: 96 IU/L (ref 48–121)
BUN/Creatinine Ratio: 19 (ref 12–28)
BUN: 15 mg/dL (ref 8–27)
Bilirubin Total: 0.7 mg/dL (ref 0.0–1.2)
CO2: 23 mmol/L (ref 20–29)
Calcium: 9.3 mg/dL (ref 8.7–10.3)
Chloride: 104 mmol/L (ref 96–106)
Creatinine, Ser: 0.78 mg/dL (ref 0.57–1.00)
GFR calc Af Amer: 87 mL/min/{1.73_m2} (ref >59)
GFR calc non Af Amer: 75 mL/min/{1.73_m2} (ref >59)
Globulin, Total: 2.2 g/dL (ref 1.5–4.5)
Glucose: 111 mg/dL — ABNORMAL HIGH (ref 65–99)
Potassium: 4 mmol/L (ref 3.5–5.2)
Sodium: 140 mmol/L (ref 134–144)
Total Protein: 6.8 g/dL (ref 6.0–8.5)

## 2019-09-02 LAB — CBC WITH DIFFERENTIAL/PLATELET
Basophils Absolute: 0 10*3/uL (ref 0.0–0.2)
Basos: 1 %
EOS (ABSOLUTE): 0.1 10*3/uL (ref 0.0–0.4)
Eos: 3 %
Hematocrit: 43.4 % (ref 34.0–46.6)
Hemoglobin: 14.5 g/dL (ref 11.1–15.9)
Immature Grans (Abs): 0 10*3/uL (ref 0.0–0.1)
Immature Granulocytes: 0 %
Lymphocytes Absolute: 1.5 10*3/uL (ref 0.7–3.1)
Lymphs: 37 %
MCH: 29.9 pg (ref 26.6–33.0)
MCHC: 33.4 g/dL (ref 31.5–35.7)
MCV: 90 fL (ref 79–97)
Monocytes Absolute: 0.4 10*3/uL (ref 0.1–0.9)
Monocytes: 10 %
Neutrophils Absolute: 2.1 10*3/uL (ref 1.4–7.0)
Neutrophils: 49 %
Platelets: 206 10*3/uL (ref 150–450)
RBC: 4.85 x10E6/uL (ref 3.77–5.28)
RDW: 13.3 % (ref 11.7–15.4)
WBC: 4.1 10*3/uL (ref 3.4–10.8)

## 2019-09-02 LAB — LIPID PANEL W/O CHOL/HDL RATIO
Cholesterol, Total: 159 mg/dL (ref 100–199)
HDL: 54 mg/dL (ref >39)
LDL Chol Calc (NIH): 85 mg/dL (ref 0–99)
Triglycerides: 114 mg/dL (ref 0–149)
VLDL Cholesterol Cal: 20 mg/dL (ref 5–40)

## 2019-09-02 LAB — CARDIOVASCULAR RISK ASSESSMENT

## 2019-09-02 NOTE — Progress Notes (Signed)
CBC normal, glucose 111, kidney and liver tests ok, Cholesterol normal,  lp

## 2019-09-03 ENCOUNTER — Other Ambulatory Visit: Payer: Self-pay

## 2019-09-03 ENCOUNTER — Ambulatory Visit (INDEPENDENT_AMBULATORY_CARE_PROVIDER_SITE_OTHER): Payer: Medicare Other | Admitting: Family Medicine

## 2019-09-03 VITALS — BP 124/64 | HR 80 | Temp 97.4°F | Wt 136.4 lb

## 2019-09-03 DIAGNOSIS — E785 Hyperlipidemia, unspecified: Secondary | ICD-10-CM | POA: Insufficient documentation

## 2019-09-03 DIAGNOSIS — N760 Acute vaginitis: Secondary | ICD-10-CM | POA: Diagnosis not present

## 2019-09-03 DIAGNOSIS — I10 Essential (primary) hypertension: Secondary | ICD-10-CM | POA: Insufficient documentation

## 2019-09-03 DIAGNOSIS — M818 Other osteoporosis without current pathological fracture: Secondary | ICD-10-CM | POA: Diagnosis not present

## 2019-09-03 NOTE — Progress Notes (Signed)
Established Patient Office Visit  Subjective:  Patient ID: Michelle Whitaker, female    DOB: 03-06-1945  Age: 74 y.o. MRN: [id number]  CC: vaginal itching Michelle Whitaker  HPI Michelle Whitaker presents for recheck-new partner after husbands death.  Pt reports itching, no drainage and redness.   09/01/19-labwork reviewed  Osteoporosis-pt resistant to trying  Fosamax and Prolia  HTN-renal function normal  Hyperlipidemia-LFT normal, TC-159/LDL-85  Past Medical History:  Diagnosis Date  . Allergy    on claritin and flonase all year   . Anxiety    situational   . Asthma   . Cataract 2018   removed bilateral  . Chronic kidney disease    kidney stone with lithotripsy  . History of palpitations   . Hyperlipidemia   . Hypertension   . Osteoporosis   . 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 BREAST EXCISIONAL BIOPSY    . BREAST SURGERY     right breast/benign  . CATARACT EXTRACTION, BILATERAL    . CERVICAL BIOPSY     benign  . COLONOSCOPY    . DILATION AND CURETTAGE OF UTERUS     due to heavy bleeding  . herniated disc  1991   lower back  . POLYPECTOMY      Family History  Problem Relation Age of Onset  . Heart disease Mother   . Stroke Father   . Hypertension Sister   . Dementia Sister   . Colon cancer Neg Hx   . Colon polyps Neg Hx   . Esophageal cancer Neg Hx   . Rectal cancer Neg Hx   . Stomach cancer Neg Hx     Social History   Socioeconomic History  . Marital status: Widowed    Spouse name: Not on file  . Number of children: Not on file  . Years of education: Not on file  . Highest education level: Not on file  Occupational History  . Not on file  Tobacco Use  . Smoking status: Never Smoker  . Smokeless tobacco: Never Used  Substance and Sexual Activity  . Alcohol use: No  . Drug use: No  . Sexual activity: Not on file  Other Topics Concern  . Not on file  Social History Narrative  . Not on file    Social Determinants of Health   Financial Resource Strain:   . Difficulty of Paying Living Expenses:   Food Insecurity:   . Worried About Charity fundraiser in the Last Year:   . Arboriculturist in the Last Year:   Transportation Needs:   . Film/video editor (Medical):   Marland Kitchen Lack of Transportation (Non-Medical):   Physical Activity:   . Days of Exercise per Week:   . Minutes of Exercise per Session:   Stress:   . Feeling of Stress :   Social Connections:   . Frequency of Communication with Friends and Family:   . Frequency of Social Gatherings with Friends and Family:   . Attends Religious Services:   . Active Member of Clubs or Organizations:   . Attends Archivist Meetings:   Marland Kitchen Marital Status:   Intimate Partner Violence:   . Fear of Current or Ex-Partner:   . Emotionally Abused:   Marland Kitchen Physically Abused:   . Sexually Abused:     Outpatient Medications Prior to Visit  Medication Sig Dispense Refill  . Albuterol (  PROVENTIL IN) Inhale into the lungs as needed.    Marland Kitchen albuterol (VENTOLIN HFA) 108 (90 Base) MCG/ACT inhaler Inhale into the lungs.    Marland Kitchen aspirin 81 MG tablet Take 81 mg by mouth daily.    Marland Kitchen atorvastatin (LIPITOR) 10 MG tablet TAKE 1/2 TABLET BY MOUTH EVERY DAY 45 tablet 3  . calcium carbonate 200 MG capsule Take 1,600 mg by mouth daily.    . Cholecalciferol (VITAMIN D-3 PO) Take 800 Units by mouth daily.    Marland Kitchen CINNAMON PO Take 1,000 mg by mouth daily.    . fluticasone (FLONASE) 50 MCG/ACT nasal spray SPRAY 2 SPRAYS INTO EACH NOSTRIL EVERY DAY 48 mL 1  . lisinopril (ZESTRIL) 10 MG tablet TAKE 1 TABLET BY MOUTH EVERY DAY 90 tablet 1  . loratadine (CLARITIN) 10 MG tablet Take 10 mg by mouth daily.    . verapamil (CALAN) 120 MG tablet Take 120 mg by mouth 2 (two) times daily.     No facility-administered medications prior to visit.    Allergies  Allergen Reactions  . Atenolol   . Codeine   . Erythromycin     REACTION: Nausea  . Morphine      REACTION: nausea/anxiety  . Penicillins   . Statins     Causes numbness side face    ROS Review of Systems  Constitutional: Negative for chills, fatigue and fever.  HENT: Negative for congestion, ear pain and sore throat.   Respiratory: Negative for cough and shortness of breath.   Cardiovascular: Negative for chest pain and palpitations.  Gastrointestinal: Negative for abdominal distention, constipation and diarrhea.  Genitourinary: Positive for urgency. Negative for dysuria.  Neurological: Negative for dizziness and headaches.  Psychiatric/Behavioral: Negative for dysphoric mood. The patient is not nervous/anxious.       Objective:     Today's Vitals   09/03/19 0855  BP: 124/64  Pulse: 80  Temp: (!) 97.4 F (36.3 C)  Weight: 136 lb 6.4 oz (61.9 kg)   Body mass index is 24.16 kg/m. Physical Exam Constitutional:      Appearance: Normal appearance.  HENT:     Head: Normocephalic and atraumatic.  Cardiovascular:     Rate and Rhythm: Normal rate.     Pulses: Normal pulses.     Heart sounds: Normal heart sounds.  Pulmonary:     Effort: Pulmonary effort is normal.     Breath sounds: Normal breath sounds.  Musculoskeletal:        General: Normal range of motion.     Cervical back: Normal range of motion.  Neurological:     Mental Status: She is oriented to person, place, and time.  Psychiatric:        Mood and Affect: Mood normal.        Behavior: Behavior normal.     BP 124/64   Pulse 80   Temp (!) 97.4 F (36.3 C)   Wt 136 lb 6.4 oz (61.9 kg)   BMI 24.16 kg/m  Wt Readings from Last 3 Encounters:  09/03/19 136 lb 6.4 oz (61.9 kg)  11/13/18 137 lb (62.1 kg)  10/30/18 137 lb (62.1 kg)     Health Maintenance Due  Topic Date Due  . Hepatitis C Screening  Never done  . COVID-19 Vaccine (1) Never done  . TETANUS/TDAP  Never done  . DEXA SCAN  Never done  . PNA vac Low Risk Adult (1 of 2 - PCV13) Never done    Lab Results  Component  Value Date   WBC  4.1 09/01/2019   HGB 14.5 09/01/2019   HCT 43.4 09/01/2019   MCV 90 09/01/2019   PLT 206 09/01/2019   Lab Results  Component Value Date   NA 140 09/01/2019   K 4.0 09/01/2019   CO2 23 09/01/2019   GLUCOSE 111 (H) 09/01/2019   BUN 15 09/01/2019   CREATININE 0.78 09/01/2019   BILITOT 0.7 09/01/2019   ALKPHOS 96 09/01/2019   AST 19 09/01/2019   ALT 18 09/01/2019   PROT 6.8 09/01/2019   ALBUMIN 4.6 09/01/2019   CALCIUM 9.3 09/01/2019   Lab Results  Component Value Date   CHOL 159 09/01/2019   Lab Results  Component Value Date   HDL 54 09/01/2019   Lab Results  Component Value Date   LDLCALC 85 09/01/2019   Lab Results  Component Value Date   TRIG 114 09/01/2019    Assessment & Plan:  1. Other osteoporosis without current pathological fracture  Reviewed DEXA-suggest reviewing pt education on Reclast - Vitamin D, 25-hydroxy Take Calcium + Vit D 2. Hyperlipidemia, unspecified hyperlipidemia type Stable-continue atorvastatin  3. Hypertension, unspecified type Stable-continue lisinpril/Calan  4. Acute vaginitis Trial of myostat/astroglide Follow-up: if no improvement in vaginitis-pelvic exam   Effie Shy, RN

## 2019-09-03 NOTE — Patient Instructions (Addendum)

## 2019-09-04 LAB — VITAMIN D 25 HYDROXY (VIT D DEFICIENCY, FRACTURES): Vit D, 25-Hydroxy: 28.2 ng/mL — ABNORMAL LOW (ref 30.0–100.0)

## 2019-09-04 LAB — SPECIMEN STATUS REPORT

## 2019-09-05 NOTE — Progress Notes (Signed)
Vitamin D level low, have patient take vitamin d 2000 units qd lp

## 2019-09-17 DIAGNOSIS — H26493 Other secondary cataract, bilateral: Secondary | ICD-10-CM | POA: Diagnosis not present

## 2019-10-02 ENCOUNTER — Other Ambulatory Visit: Payer: Self-pay | Admitting: Family Medicine

## 2019-11-03 ENCOUNTER — Other Ambulatory Visit: Payer: Self-pay

## 2019-11-03 ENCOUNTER — Ambulatory Visit (INDEPENDENT_AMBULATORY_CARE_PROVIDER_SITE_OTHER): Payer: Medicare Other

## 2019-11-03 DIAGNOSIS — Z23 Encounter for immunization: Secondary | ICD-10-CM

## 2019-11-03 NOTE — Progress Notes (Signed)
    Patient Name: Michelle Whitaker Patient DOB: Oct 14, 1945  11/03/2019  Michelle Whitaker came in today for her 2021-2022 influenza vaccine.  Patient tolerated it well.   [name]

## 2019-12-29 ENCOUNTER — Other Ambulatory Visit: Payer: Self-pay | Admitting: Family Medicine

## 2019-12-29 DIAGNOSIS — Z0000 Encounter for general adult medical examination without abnormal findings: Secondary | ICD-10-CM

## 2020-01-23 ENCOUNTER — Other Ambulatory Visit: Payer: Self-pay | Admitting: Family Medicine

## 2020-02-10 ENCOUNTER — Other Ambulatory Visit: Payer: Self-pay

## 2020-02-10 ENCOUNTER — Ambulatory Visit
Admission: RE | Admit: 2020-02-10 | Discharge: 2020-02-10 | Disposition: A | Discharge status: Home / Self Care | Payer: Medicare Other | Source: Ambulatory Visit | Attending: Family Medicine | Admitting: Family Medicine

## 2020-02-10 DIAGNOSIS — Z1231 Encounter for screening mammogram for malignant neoplasm of breast: Secondary | ICD-10-CM | POA: Diagnosis not present

## 2020-02-10 DIAGNOSIS — Z0000 Encounter for general adult medical examination without abnormal findings: Secondary | ICD-10-CM

## 2020-03-02 ENCOUNTER — Encounter: Payer: Self-pay | Admitting: Family Medicine

## 2020-03-02 ENCOUNTER — Ambulatory Visit (INDEPENDENT_AMBULATORY_CARE_PROVIDER_SITE_OTHER): Payer: Medicare Other | Admitting: Family Medicine

## 2020-03-02 ENCOUNTER — Other Ambulatory Visit: Payer: Self-pay

## 2020-03-02 VITALS — BP 134/72 | HR 72 | Temp 97.0°F | Ht 63.0 in | Wt 137.4 lb

## 2020-03-02 DIAGNOSIS — Z0000 Encounter for general adult medical examination without abnormal findings: Secondary | ICD-10-CM | POA: Diagnosis not present

## 2020-03-02 DIAGNOSIS — Z13820 Encounter for screening for osteoporosis: Secondary | ICD-10-CM

## 2020-03-02 DIAGNOSIS — Z780 Asymptomatic menopausal state: Secondary | ICD-10-CM

## 2020-03-02 NOTE — Progress Notes (Signed)
Subjective:  Patient ID: Michelle Whitaker, female    DOB: [birth year]  Age: 75 y.o. MRN: [id number]  Chief Complaint  Patient presents with  . Annual Exam    HPI Encounter for general adult medical examination without abnormal findings  Physical ("At Risk" items are starred): Patient's last physical exam was 1 year ago .  Smoking: Life-long non-smoker ;  Physical Activity: Exercises at least 3 times per week ;  Alcohol/Drug Use: Is a non-drinker ; No illicit drug use ;  Patient is not afflicted from Stress Incontinence and Urge Incontinence  Safety: reviewed ; Patient wears a seat belt, has smoke detectors, has carbon monoxide detectors, practices appropriate gun safety, and wears sunscreen with extended sun exposure. Dental Care: biannual cleanings, brushes and flosses daily. Ophthalmology/Optometry: Annual visit.  Hearing loss: none Vision impairments: none  Fall Risk  03/02/2020 01/04/2018 09/21/2017 10/18/2015  Falls in the past year? 0 0 No No  Comment - Emmi Telephone Survey: data to providers prior to load Emmi Telephone Survey: data to providers prior to load Emmi Telephone Survey: data to providers prior to load  Number falls in past yr: 0 - - -  Injury with Fall? 0 - - -  Risk for fall due to : No Fall Risks - - -  Follow up Falls evaluation completed;Falls prevention discussed - - -     Depression screen PHQ 2/9 03/02/2020  Decreased Interest 0  Down, Depressed, Hopeless 0  PHQ - 2 Score 0       Functional Status Survey: Is the patient deaf or have difficulty hearing?: No Does the patient have difficulty seeing, even when wearing glasses/contacts?: No Does the patient have difficulty concentrating, remembering, or making decisions?: No Does the patient have difficulty walking or climbing stairs?: No Does the patient have difficulty dressing or bathing?: No Does the patient have difficulty doing errands alone such as visiting a doctor's office or shopping?: No   Social  Hx   Social History   Socioeconomic History  . Marital status: Widowed    Spouse name: Not on file  . Number of children: Not on file  . Years of education: Not on file  . Highest education level: Not on file  Occupational History  . Not on file  Tobacco Use  . Smoking status: Never Smoker  . Smokeless tobacco: Never Used  Substance and Sexual Activity  . Alcohol use: No  . Drug use: No  . Sexual activity: Not on file  Other Topics Concern  . Not on file  Social History Narrative  . Not on file   Social Determinants of Health   Financial Resource Strain: Not on file  Food Insecurity: Not on file  Transportation Needs: Not on file  Physical Activity: Not on file  Stress: No Stress Concern Present  . Feeling of Stress : Not at all  Social Connections: Not on file   Past Medical History:  Diagnosis Date  . Allergy    on claritin and flonase all year   . Anxiety    situational   . Asthma   . Cataract 2018   removed bilateral  . Chronic kidney disease    kidney stone with lithotripsy  . History of palpitations   . Hyperlipidemia   . Hypertension   . Osteoporosis   . 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 Heart disease Mother   .  Stroke Father   . Hypertension Sister   . Dementia Sister   . Colon cancer Neg Hx   . Colon polyps Neg Hx   . Esophageal cancer Neg Hx   . Rectal cancer Neg Hx   . Stomach cancer Neg Hx     Review of Systems   Objective:  BP 134/72   Pulse 72   Temp (!) 97 F (36.1 C)   Ht 5\' 3"  (1.6 m)   Wt 137 lb 6.4 oz (62.3 kg)   BMI 24.34 kg/m   BP/Weight 03/02/2020 09/03/2019 3/38/2505  Systolic BP 397 673 419  Diastolic BP 72 64 79  Wt. (Lbs) 137.4 136.4 137  BMI 24.34 24.16 24.27    Physical Exam Vitals reviewed.  Constitutional:      Appearance: Normal appearance. She is normal weight.  Cardiovascular:     Rate and Rhythm: Normal rate and regular rhythm.     Pulses:  Normal pulses.     Heart sounds: Normal heart sounds.  Pulmonary:     Effort: Pulmonary effort is normal. No respiratory distress.     Breath sounds: Normal breath sounds.  Neurological:     Mental Status: She is alert and oriented to person, place, and time.  Psychiatric:        Mood and Affect: Mood normal.        Behavior: Behavior normal.    Lab Results  Component Value Date   WBC 4.1 09/01/2019   HGB 14.5 09/01/2019   HCT 43.4 09/01/2019   PLT 206 09/01/2019   GLUCOSE 111 (H) 09/01/2019   CHOL 159 09/01/2019   TRIG 114 09/01/2019   HDL 54 09/01/2019   LDLCALC 85 09/01/2019   ALT 18 09/01/2019   AST 19 09/01/2019   NA 140 09/01/2019   K 4.0 09/01/2019   CL 104 09/01/2019   CREATININE 0.78 09/01/2019   BUN 15 09/01/2019   CO2 23 09/01/2019      Assessment & Plan:  1. General medical exam   These are the goals we discussed: Goals   Recommend continue to work on eating healthy diet and exercise. Order bone density. Unable to get shingrix vaccine due to cost.      This is a list of the screening recommended for you and due dates:  Health Maintenance  Topic Date Due  .  Hepatitis C: One time screening is recommended by Center for Disease Control  (CDC) for  adults born from [birth year] through 1966.   Never done  . DEXA scan (bone density measurement)  Never done  . Tetanus Vaccine  02/20/2025  . Flu Shot  Completed  . COVID-19 Vaccine  Completed  . Pneumonia vaccines  Completed     AN INDIVIDUALIZED CARE PLAN: was established or reinforced today.   SELF MANAGEMENT: The patient and I together assessed ways to personally work towards obtaining the recommended goals  Support needs The patient and/or family needs were assessed and services were offered and not necessary at this time.    Follow-up: No follow-ups on file.  [name], MD [facility] [phone]

## 2020-03-02 NOTE — Patient Instructions (Signed)
Preventive Care 76 Years and Older, Female Preventive care refers to lifestyle choices and visits with your health care provider that can promote health and wellness. This includes:  A yearly physical exam. This is also called an annual wellness visit.  Regular dental and eye exams.  Immunizations.  Screening for certain conditions.  Healthy lifestyle choices, such as: ? Eating a healthy diet. ? Getting regular exercise. ? Not using drugs or products that contain nicotine and tobacco. ? Limiting alcohol use. What can I expect for my preventive care visit? Physical exam Your health care provider will check your:  Height and weight. These may be used to calculate your BMI (body mass index). BMI is a measurement that tells if you are at a healthy weight.  Heart rate and blood pressure.  Body temperature.  Skin for abnormal spots. Counseling Your health care provider may ask you questions about your:  Past medical problems.  Family's medical history.  Alcohol, tobacco, and drug use.  Emotional well-being.  Home life and relationship well-being.  Sexual activity.  Diet, exercise, and sleep habits.  History of falls.  Memory and ability to understand (cognition).  Work and work Statistician.  Pregnancy and menstrual history.  Access to firearms. What immunizations do I need? Vaccines are usually given at various ages, according to a schedule. Your health care provider will recommend vaccines for you based on your age, medical history, and lifestyle or other factors, such as travel or where you work.   What tests do I need? Blood tests  Lipid and cholesterol levels. These may be checked every 5 years, or more often depending on your overall health.  Hepatitis C test.  Hepatitis B test. Screening  Lung cancer screening. You may have this screening every year starting at age 76 if you have a 30-pack-year history of smoking and currently smoke or have quit within  the past 15 years.  Colorectal cancer screening. ? All adults should have this screening starting at age 76 and continuing until age 76. ? Your health care provider may recommend screening at age 76 if you are at increased risk. ? You will have tests every 1-10 years, depending on your results and the type of screening test.  Diabetes screening. ? This is done by checking your blood sugar (glucose) after you have not eaten for a while (fasting). ? You may have this done every 1-3 years.  Mammogram. ? This may be done every 1-2 years. ? Talk with your health care provider about how often you should have regular mammograms.  Abdominal aortic aneurysm (AAA) screening. You may need this if you are a current or former smoker.  BRCA-related cancer screening. This may be done if you have a family history of breast, ovarian, tubal, or peritoneal cancers. Other tests  STD (sexually transmitted disease) testing, if you are at risk.  Bone density scan. This is done to screen for osteoporosis. You may have this done starting at age 76. Talk with your health care provider about your test results, treatment options, and if necessary, the need for more tests. Follow these instructions at home: Eating and drinking  Eat a diet that includes fresh fruits and vegetables, whole grains, lean protein, and low-fat dairy products. Limit your intake of foods with high amounts of sugar, saturated fats, and salt.  Take vitamin and mineral supplements as recommended by your health care provider.  Do not drink alcohol if your health care provider tells you not to drink.  If you drink alcohol: ? Limit how much you have to 0-1 drink a day. ? Be aware of how much alcohol is in your drink. In the [place] one drink equals one 12 oz bottle of beer (355 mL), one 5 oz glass of wine (148 mL), or one 1 oz glass of hard liquor (44 mL).   Lifestyle  Take daily care of your teeth and gums. Brush your teeth every morning  and night with fluoride toothpaste. Floss one time each day.  Stay active. Exercise for at least 30 minutes 5 or more days each week.  Do not use any products that contain nicotine or tobacco, such as cigarettes, e-cigarettes, and chewing tobacco. If you need help quitting, ask your health care provider.  Do not use drugs.  If you are sexually active, practice safe sex. Use a condom or other form of protection in order to prevent STIs (sexually transmitted infections).  Talk with your health care provider about taking a low-dose aspirin or statin.  Find healthy ways to cope with stress, such as: ? Meditation, yoga, or listening to music. ? Journaling. ? Talking to a trusted person. ? Spending time with friends and family. Safety  Always wear your seat belt while driving or riding in a vehicle.  Do not drive: ? If you have been drinking alcohol. Do not ride with someone who has been drinking. ? When you are tired or distracted. ? While texting.  Wear a helmet and other protective equipment during sports activities.  If you have firearms in your house, make sure you follow all gun safety procedures. What's next?  Visit your health care provider once a year for an annual wellness visit.  Ask your health care provider how often you should have your eyes and teeth checked.  Stay up to date on all vaccines. This information is not intended to replace advice given to you by your health care provider. Make sure you discuss any questions you have with your health care provider. Document Revised: 01/28/2020 Document Reviewed: 01/31/2018 Elsevier Patient Education  2021 [employer]  

## 2020-03-07 ENCOUNTER — Other Ambulatory Visit: Payer: Self-pay | Admitting: Family Medicine

## 2020-03-08 ENCOUNTER — Other Ambulatory Visit: Payer: Medicare Other

## 2020-03-09 ENCOUNTER — Other Ambulatory Visit: Payer: Medicare Other

## 2020-03-09 ENCOUNTER — Other Ambulatory Visit: Payer: Self-pay

## 2020-03-09 ENCOUNTER — Encounter: Payer: Self-pay | Admitting: Family Medicine

## 2020-03-09 ENCOUNTER — Ambulatory Visit (INDEPENDENT_AMBULATORY_CARE_PROVIDER_SITE_OTHER): Payer: Medicare Other | Admitting: Family Medicine

## 2020-03-09 VITALS — BP 118/70 | HR 78 | Temp 97.6°F | Resp 16 | Ht 63.0 in | Wt 137.0 lb

## 2020-03-09 DIAGNOSIS — E782 Mixed hyperlipidemia: Secondary | ICD-10-CM | POA: Diagnosis not present

## 2020-03-09 DIAGNOSIS — I10 Essential (primary) hypertension: Secondary | ICD-10-CM

## 2020-03-09 DIAGNOSIS — J4520 Mild intermittent asthma, uncomplicated: Secondary | ICD-10-CM | POA: Diagnosis not present

## 2020-03-09 DIAGNOSIS — R7301 Impaired fasting glucose: Secondary | ICD-10-CM

## 2020-03-09 MED ORDER — ALBUTEROL SULFATE HFA 108 (90 BASE) MCG/ACT IN AERS: 2.0000 | INHALATION_SPRAY | Freq: Four times a day (QID) | RESPIRATORY_TRACT | 2 refills | Status: DC | PRN

## 2020-03-09 NOTE — Progress Notes (Signed)
Subjective:  Patient ID: Michelle Whitaker, female    DOB: [birth year]  Age: 75 y.o. MRN: [id number]  Chief Complaint  Patient presents with  . Hypertension  . Hyperlipidemia    HPI Patient presents for follow-up of hypertension and hyperlipidemia.  She is currently on Lipitor 10 mg once daily for cholesterol.  Her blood pressure medicine is Zestril 10 mg once daily and verapamil 120 mg twice daily.  Her blood pressures usually run in the 120s over 80s.  Patient works hard on eating healthy and exercising.  She does take a baby aspirin once daily. Sugar was elevated at 111 in July 2021.   Patient has asthma.  She does not take an inhaler on a regular basis.  It is classified as mild intermittent.  She needs a refill of her Ventolin. Current Outpatient Medications on File Prior to Visit  Medication Sig Dispense Refill  . aspirin 81 MG tablet Take 81 mg by mouth daily.    Marland Kitchen atorvastatin (LIPITOR) 10 MG tablet TAKE 1/2 TABLET BY MOUTH EVERY DAY 45 tablet 3  . calcium carbonate 200 MG capsule Take 1,600 mg by mouth daily.    . Cholecalciferol (VITAMIN D-3 PO) Take 800 Units by mouth daily.    Marland Kitchen CINNAMON PO Take 1,000 mg by mouth 2 (two) times daily.    . fluticasone (FLONASE) 50 MCG/ACT nasal spray SPRAY 2 SPRAYS INTO EACH NOSTRIL EVERY DAY 48 mL 1  . lisinopril (ZESTRIL) 10 MG tablet TAKE 1 TABLET BY MOUTH EVERY DAY 90 tablet 1  . loratadine (CLARITIN) 10 MG tablet Take 10 mg by mouth daily.    . verapamil (CALAN) 120 MG tablet TAKE 1 TABLET BY MOUTH TWICE A DAY 180 tablet 3   No current facility-administered medications on file prior to visit.   Past Medical History:  Diagnosis Date  . Allergy    on claritin and flonase all year   . Anxiety    situational   . Asthma   . Cataract 2018   removed bilateral  . Chronic kidney disease    kidney stone with lithotripsy  . History of palpitations   . Hyperlipidemia   . Hypertension   . Osteoporosis   . 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 BREAST EXCISIONAL BIOPSY    . BREAST SURGERY     right breast/benign  . CATARACT EXTRACTION, BILATERAL    . CERVICAL BIOPSY     benign  . COLONOSCOPY    . DILATION AND CURETTAGE OF UTERUS     due to heavy bleeding  . herniated disc  1991   lower back  . POLYPECTOMY      Family History  Problem Relation Age of Onset  . Heart disease Mother   . Stroke Father   . Hypertension Sister   . Dementia Sister   . Colon cancer Neg Hx   . Colon polyps Neg Hx   . Esophageal cancer Neg Hx   . Rectal cancer Neg Hx   . Stomach cancer Neg Hx    Social History   Socioeconomic History  . Marital status: Widowed    Spouse name: Not on file  . Number of children: Not on file  . Years of education: Not on file  . Highest education level: Not on file  Occupational History  . Not on file  Tobacco Use  . Smoking status: Never Smoker  .  Smokeless tobacco: Never Used  Substance and Sexual Activity  . Alcohol use: No  . Drug use: No  . Sexual activity: Not on file  Other Topics Concern  . Not on file  Social History Narrative  . Not on file   Social Determinants of Health   Financial Resource Strain: Not on file  Food Insecurity: Not on file  Transportation Needs: Not on file  Physical Activity: Not on file  Stress: No Stress Concern Present  . Feeling of Stress : Not at all  Social Connections: Not on file    Review of Systems  Constitutional: Negative for chills, fatigue and fever.  HENT: Negative for congestion, rhinorrhea and sore throat.   Respiratory: Negative for cough and shortness of breath.   Cardiovascular: Negative for chest pain.  Gastrointestinal: Negative for abdominal pain, constipation, diarrhea, nausea and vomiting.  Genitourinary: Negative for dysuria and urgency.  Musculoskeletal: Positive for back pain. Negative for myalgias.  Neurological: Negative for dizziness, weakness, light-headedness and  headaches.  Psychiatric/Behavioral: Negative for dysphoric mood. The patient is not nervous/anxious.      Objective:  BP 118/70   Pulse 78   Temp 97.6 F (36.4 C)   Resp 16   Ht 5\' 3"  (1.6 m)   Wt 137 lb (62.1 kg)   BMI 24.27 kg/m   BP/Weight 03/09/2020 03/02/2020 1/47/8295  Systolic BP 621 308 657  Diastolic BP 70 72 64  Wt. (Lbs) 137 137.4 136.4  BMI 24.27 24.34 24.16    Physical Exam Vitals reviewed.  Constitutional:      Appearance: Normal appearance. She is normal weight.  Neck:     Vascular: No carotid bruit.  Cardiovascular:     Rate and Rhythm: Normal rate and regular rhythm.     Pulses: Normal pulses.     Heart sounds: Normal heart sounds.  Pulmonary:     Effort: Pulmonary effort is normal. No respiratory distress.     Breath sounds: Normal breath sounds.  Abdominal:     General: Abdomen is flat. Bowel sounds are normal.     Palpations: Abdomen is soft.     Tenderness: There is no abdominal tenderness.  Neurological:     Mental Status: She is alert and oriented to person, place, and time.  Psychiatric:        Mood and Affect: Mood normal.        Behavior: Behavior normal.     Diabetic Foot Exam - Simple   No data filed      Lab Results  Component Value Date   WBC 4.1 09/01/2019   HGB 14.5 09/01/2019   HCT 43.4 09/01/2019   PLT 206 09/01/2019   GLUCOSE 111 (H) 09/01/2019   CHOL 159 09/01/2019   TRIG 114 09/01/2019   HDL 54 09/01/2019   LDLCALC 85 09/01/2019   ALT 18 09/01/2019   AST 19 09/01/2019   NA 140 09/01/2019   K 4.0 09/01/2019   CL 104 09/01/2019   CREATININE 0.78 09/01/2019   BUN 15 09/01/2019   CO2 23 09/01/2019      Assessment & Plan:   1. Mixed hyperlipidemia Continue current medications. Make adjustments when labs come back. Continue eating healthy and exercise - Lipid panel  2. Essential hypertension, benign Well-controlled.  Continue current medications. - CBC with Differential/Platelet - Comprehensive  metabolic panel - TSH  3. Impaired fasting glucose Low sugar diet. - Hemoglobin A1c  4. Mild intermittent asthma without complication Refill Ventolin HFA. Well-controlled.  Meds ordered this encounter  Medications  . albuterol (VENTOLIN HFA) 108 (90 Base) MCG/ACT inhaler    Sig: Inhale 2 puffs into the lungs every 6 (six) hours as needed for wheezing or shortness of breath.    Dispense:  1 each    Refill:  2    Orders Placed This Encounter  Procedures  . CBC with Differential/Platelet  . Comprehensive metabolic panel  . Hemoglobin A1c  . Lipid panel  . TSH    Follow-up: Return in about 6 months (around 09/06/2020) for fasting.  An After Visit Summary was printed and given to the patient.  [name], MD [facility] [phone]

## 2020-03-10 ENCOUNTER — Ambulatory Visit: Payer: Medicare Other | Admitting: Family Medicine

## 2020-03-10 LAB — CBC WITH DIFFERENTIAL/PLATELET
Basophils Absolute: 0 10*3/uL (ref 0.0–0.2)
Basos: 0 %
EOS (ABSOLUTE): 0.1 10*3/uL (ref 0.0–0.4)
Eos: 2 %
Hematocrit: 42.7 % (ref 34.0–46.6)
Hemoglobin: 14.6 g/dL (ref 11.1–15.9)
Immature Grans (Abs): 0 10*3/uL (ref 0.0–0.1)
Immature Granulocytes: 0 %
Lymphocytes Absolute: 1 10*3/uL (ref 0.7–3.1)
Lymphs: 22 %
MCH: 29.1 pg (ref 26.6–33.0)
MCHC: 34.2 g/dL (ref 31.5–35.7)
MCV: 85 fL (ref 79–97)
Monocytes Absolute: 0.5 10*3/uL (ref 0.1–0.9)
Monocytes: 12 %
Neutrophils Absolute: 2.8 10*3/uL (ref 1.4–7.0)
Neutrophils: 64 %
Platelets: 211 10*3/uL (ref 150–450)
RBC: 5.02 x10E6/uL (ref 3.77–5.28)
RDW: 12.4 % (ref 11.7–15.4)
WBC: 4.5 10*3/uL (ref 3.4–10.8)

## 2020-03-10 LAB — COMPREHENSIVE METABOLIC PANEL
ALT: 22 IU/L (ref 0–32)
AST: 19 IU/L (ref 0–40)
Albumin/Globulin Ratio: 1.9 (ref 1.2–2.2)
Albumin: 4.6 g/dL (ref 3.7–4.7)
Alkaline Phosphatase: 111 IU/L (ref 44–121)
BUN/Creatinine Ratio: 17 (ref 12–28)
BUN: 13 mg/dL (ref 8–27)
Bilirubin Total: 0.6 mg/dL (ref 0.0–1.2)
CO2: 24 mmol/L (ref 20–29)
Calcium: 9.6 mg/dL (ref 8.7–10.3)
Chloride: 101 mmol/L (ref 96–106)
Creatinine, Ser: 0.76 mg/dL (ref 0.57–1.00)
GFR calc Af Amer: 89 mL/min/{1.73_m2} (ref >59)
GFR calc non Af Amer: 77 mL/min/{1.73_m2} (ref >59)
Globulin, Total: 2.4 g/dL (ref 1.5–4.5)
Glucose: 114 mg/dL — ABNORMAL HIGH (ref 65–99)
Potassium: 4 mmol/L (ref 3.5–5.2)
Sodium: 139 mmol/L (ref 134–144)
Total Protein: 7 g/dL (ref 6.0–8.5)

## 2020-03-10 LAB — HEMOGLOBIN A1C
Est. average glucose Bld gHb Est-mCnc: 126 mg/dL
Hgb A1c MFr Bld: 6 % — ABNORMAL HIGH (ref 4.8–5.6)

## 2020-03-10 LAB — LIPID PANEL
Chol/HDL Ratio: 3.5 ratio (ref 0.0–4.4)
Cholesterol, Total: 201 mg/dL — ABNORMAL HIGH (ref 100–199)
HDL: 57 mg/dL (ref >39)
LDL Chol Calc (NIH): 113 mg/dL — ABNORMAL HIGH (ref 0–99)
Triglycerides: 178 mg/dL — ABNORMAL HIGH (ref 0–149)
VLDL Cholesterol Cal: 31 mg/dL (ref 5–40)

## 2020-03-10 LAB — TSH: TSH: 2.6 u[IU]/mL (ref 0.450–4.500)

## 2020-03-10 LAB — CARDIOVASCULAR RISK ASSESSMENT

## 2020-03-11 ENCOUNTER — Encounter: Payer: Self-pay | Admitting: Nurse Practitioner

## 2020-03-11 ENCOUNTER — Telehealth (INDEPENDENT_AMBULATORY_CARE_PROVIDER_SITE_OTHER): Payer: Medicare Other | Admitting: Nurse Practitioner

## 2020-03-11 DIAGNOSIS — Z20822 Contact with and (suspected) exposure to covid-19: Secondary | ICD-10-CM

## 2020-03-11 DIAGNOSIS — U071 COVID-19: Secondary | ICD-10-CM

## 2020-03-11 NOTE — Progress Notes (Signed)
Virtual Visit via Telephone Note   This visit type was conducted due to national recommendations for restrictions regarding the COVID-19 Pandemic (e.g. social distancing) in an effort to limit this patient's exposure and mitigate transmission in our community.  Due to her co-morbid illnesses, this patient is at least at moderate risk for complications without adequate follow up.  This format is felt to be most appropriate for this patient at this time.  The patient did not have access to video technology/had technical difficulties with video requiring transitioning to audio format only (telephone).  All issues noted in this document were discussed and addressed.  No physical exam could be performed with this format.  Patient verbally consented to a telehealth visit.   Date:  03/11/2020   ID:  NAJIYAH PARIS, DOB 1946/01/14, MRN [id number]  Patient Location: Home Provider Location: Office/Clinic  PCP:  [name], MD   Evaluation Performed:  Established patient, acute telemedicine visit  Chief Complaint: Nasal congestion  History of Present Illness:    LOWELL MCGURK is a 75 y.o. female with nasal congestion and dry, non-productive cough. Onset was two days ago. Treatment includes Flonase and Claritin. She states she has known exposure to son and daughter-in-law with COVID-19 symptoms. She has obtained COVID-19 vaccines x 3 and seasonal flu vaccine. She states she wants to have a rapid COVID-19 test prior to attending a funeral for peace-of-mind.    The patient does have symptoms concerning for COVID-19 infection (fever, chills, cough, or new shortness of breath).    Past Medical History:  Diagnosis Date  . Allergy    on claritin and flonase all year   . Anxiety    situational   . Asthma   . Cataract 2018   removed bilateral  . Chronic kidney disease    kidney stone with lithotripsy  . History of palpitations   . Hyperlipidemia   . Hypertension   . Osteoporosis   . Polio    as  child/left side is weaker    Past Surgical History:  Procedure Laterality Date  . BREAST EXCISIONAL BIOPSY    . BREAST SURGERY     right breast/benign  . CATARACT EXTRACTION, BILATERAL    . CERVICAL BIOPSY     benign  . COLONOSCOPY    . DILATION AND CURETTAGE OF UTERUS     due to heavy bleeding  . herniated disc  1991   lower back  . POLYPECTOMY      Family History  Problem Relation Age of Onset  . Heart disease Mother   . Stroke Father   . Hypertension Sister   . Dementia Sister   . Colon cancer Neg Hx   . Colon polyps Neg Hx   . Esophageal cancer Neg Hx   . Rectal cancer Neg Hx   . Stomach cancer Neg Hx     Social History   Socioeconomic History  . Marital status: Widowed    Spouse name: Not on file  . Number of children: Not on file  . Years of education: Not on file  . Highest education level: Not on file  Occupational History  . Not on file  Tobacco Use  . Smoking status: Never Smoker  . Smokeless tobacco: Never Used  Substance and Sexual Activity  . Alcohol use: No  . Drug use: No  . Sexual activity: Not on file  Other Topics Concern  . Not on file  Social History Narrative  . Not on  file   Social Determinants of Health   Financial Resource Strain: Not on file  Food Insecurity: Not on file  Transportation Needs: Not on file  Physical Activity: Not on file  Stress: No Stress Concern Present  . Feeling of Stress : Not at all  Social Connections: Not on file  Intimate Partner Violence: Not on file    Outpatient Medications Prior to Visit  Medication Sig Dispense Refill  . albuterol (VENTOLIN HFA) 108 (90 Base) MCG/ACT inhaler Inhale 2 puffs into the lungs every 6 (six) hours as needed for wheezing or shortness of breath. 1 each 2  . aspirin 81 MG tablet Take 81 mg by mouth daily.    Marland Kitchen atorvastatin (LIPITOR) 10 MG tablet TAKE 1/2 TABLET BY MOUTH EVERY DAY 45 tablet 3  . calcium carbonate 200 MG capsule Take 1,600 mg by mouth daily.    .  Cholecalciferol (VITAMIN D-3 PO) Take 800 Units by mouth daily.    Marland Kitchen CINNAMON PO Take 1,000 mg by mouth 2 (two) times daily.    . fluticasone (FLONASE) 50 MCG/ACT nasal spray SPRAY 2 SPRAYS INTO EACH NOSTRIL EVERY DAY 48 mL 1  . lisinopril (ZESTRIL) 10 MG tablet TAKE 1 TABLET BY MOUTH EVERY DAY 90 tablet 1  . loratadine (CLARITIN) 10 MG tablet Take 10 mg by mouth daily.    . verapamil (CALAN) 120 MG tablet TAKE 1 TABLET BY MOUTH TWICE A DAY 180 tablet 3   No facility-administered medications prior to visit.    Allergies:   Atenolol, Codeine, Erythromycin, Morphine, Penicillins, and Statins   Social History   Tobacco Use  . Smoking status: Never Smoker  . Smokeless tobacco: Never Used  Substance Use Topics  . Alcohol use: No  . Drug use: No     Review of Systems  Constitutional: Negative for chills, fever and malaise/fatigue.  HENT: Positive for congestion (post-nasal drip). Negative for sore throat.   Eyes: Negative for pain.  Respiratory: Positive for cough. Negative for sputum production.   Cardiovascular: Negative for chest pain and orthopnea.  Gastrointestinal: Negative for abdominal pain, diarrhea, nausea and vomiting.  Genitourinary: Negative for dysuria, frequency and urgency.  Musculoskeletal: Negative for back pain, joint pain and myalgias.  Skin: Negative for rash.  Neurological: Negative for headaches.     Labs/Other Tests and Data Reviewed:    Recent Labs: 03/09/2020: ALT 22; BUN 13; Creatinine, Ser 0.76; Hemoglobin 14.6; Platelets 211; Potassium 4.0; Sodium 139; TSH 2.600   Recent Lipid Panel Lab Results  Component Value Date/Time   CHOL 201 (H) 03/09/2020 09:25 AM   TRIG 178 (H) 03/09/2020 09:25 AM   HDL 57 03/09/2020 09:25 AM   CHOLHDL 3.5 03/09/2020 09:25 AM   LDLCALC 113 (H) 03/09/2020 09:25 AM    Wt Readings from Last 3 Encounters:  03/09/20 137 lb (62.1 kg)  03/02/20 137 lb 6.4 oz (62.3 kg)  09/03/19 136 lb 6.4 oz (61.9 kg)     Objective:     Vital Signs:  There were no vitals taken for this visit.   Physical Exam No physical exam due to telemedicine visit  ASSESSMENT & PLAN:    1. COVID-19 - POC COVID-19   Rest and push fluids Treat symptoms with OTC URI cold remedies Notify office if symptoms worsen or fail to improve  COVID-19 Education: The signs and symptoms of COVID-19 were discussed with the patient and how to seek care for testing (follow up with PCP or arrange E-visit). The importance  of social distancing was discussed today.   I spent 10 minutes dedicated to the care of this patient on the date of this encounter to include telephone time with the patient, as well as: EMR review.  Follow Up:  Virtual Visit  prn  Signed,  Rip Harbour, NP  03/11/2020 4:51 PM    [place]

## 2020-03-15 ENCOUNTER — Ambulatory Visit: Payer: Medicare Other | Admitting: Family Medicine

## 2020-03-17 DIAGNOSIS — R5383 Other fatigue: Secondary | ICD-10-CM | POA: Diagnosis not present

## 2020-03-17 DIAGNOSIS — Z20828 Contact with and (suspected) exposure to other viral communicable diseases: Secondary | ICD-10-CM | POA: Diagnosis not present

## 2020-04-09 DIAGNOSIS — M85852 Other specified disorders of bone density and structure, left thigh: Secondary | ICD-10-CM | POA: Diagnosis not present

## 2020-04-09 DIAGNOSIS — M810 Age-related osteoporosis without current pathological fracture: Secondary | ICD-10-CM | POA: Diagnosis not present

## 2020-04-13 ENCOUNTER — Telehealth: Payer: Self-pay

## 2020-04-13 NOTE — Telephone Encounter (Signed)
Michelle Whitaker was notified of her dexa scan results.  She refused treatment with alendronate and she also refused prolia at this time.  She has researched both and she is concerned about side effects.

## 2020-07-23 ENCOUNTER — Other Ambulatory Visit: Payer: Self-pay | Admitting: Family Medicine

## 2020-07-30 ENCOUNTER — Telehealth: Payer: Self-pay | Admitting: Family Medicine

## 2020-07-30 NOTE — Chronic Care Management (AMB) (Signed)
  Chronic Care Management   Note  07/30/2020 Name: Michelle Whitaker MRN: [id number] DOB: 01-Nov-1945  Michelle Whitaker is a 75 y.o. year old female who is a primary care patient of Brodzinski, Kirsten, MD. I reached out to [place] by phone today in response to a referral sent by Michelle Whitaker's PCP, Round, Kirsten, MD.   [name] was given information about Chronic Care Management services today including:  CCM service includes personalized support from designated clinical staff supervised by her physician, including individualized plan of care and coordination with other care providers 24/7 contact phone numbers for assistance for urgent and routine care needs. Service will only be billed when office clinical staff spend 20 minutes or more in a month to coordinate care. Only one practitioner may furnish and bill the service in a calendar month. The patient may stop CCM services at any time (effective at the end of the month) by phone call to the office staff.   Patient agreed to services and verbal consent obtained.   Follow up plan:   Tatjana Secretary/administrator

## 2020-07-30 NOTE — Chronic Care Management (AMB) (Signed)
  Chronic Care Management   Outreach Note  07/30/2020 Name: Michelle Whitaker MRN: [id number] DOB: 1945/08/02  Referred by: [name], MD Reason for referral : No chief complaint on file.   An unsuccessful telephone outreach was attempted today. The patient was referred to the pharmacist for assistance with care management and care coordination.   Follow Up Plan:   Tatjana Dellinger Upstream Scheduler

## 2020-09-06 ENCOUNTER — Other Ambulatory Visit: Payer: Medicare Other

## 2020-09-06 DIAGNOSIS — I10 Essential (primary) hypertension: Secondary | ICD-10-CM

## 2020-09-06 DIAGNOSIS — E559 Vitamin D deficiency, unspecified: Secondary | ICD-10-CM | POA: Diagnosis not present

## 2020-09-06 DIAGNOSIS — E782 Mixed hyperlipidemia: Secondary | ICD-10-CM

## 2020-09-06 DIAGNOSIS — R7303 Prediabetes: Secondary | ICD-10-CM

## 2020-09-07 ENCOUNTER — Encounter: Payer: Self-pay | Admitting: Family Medicine

## 2020-09-07 LAB — CBC WITH DIFFERENTIAL/PLATELET
Basophils Absolute: 0 10*3/uL (ref 0.0–0.2)
Basos: 1 %
EOS (ABSOLUTE): 0.1 10*3/uL (ref 0.0–0.4)
Eos: 2 %
Hematocrit: 42.8 % (ref 34.0–46.6)
Hemoglobin: 14.4 g/dL (ref 11.1–15.9)
Immature Grans (Abs): 0 10*3/uL (ref 0.0–0.1)
Immature Granulocytes: 0 %
Lymphocytes Absolute: 1.4 10*3/uL (ref 0.7–3.1)
Lymphs: 35 %
MCH: 29.4 pg (ref 26.6–33.0)
MCHC: 33.6 g/dL (ref 31.5–35.7)
MCV: 87 fL (ref 79–97)
Monocytes Absolute: 0.4 10*3/uL (ref 0.1–0.9)
Monocytes: 10 %
Neutrophils Absolute: 2.2 10*3/uL (ref 1.4–7.0)
Neutrophils: 52 %
Platelets: 195 10*3/uL (ref 150–450)
RBC: 4.9 x10E6/uL (ref 3.77–5.28)
RDW: 12.9 % (ref 11.7–15.4)
WBC: 4.2 10*3/uL (ref 3.4–10.8)

## 2020-09-07 LAB — HEMOGLOBIN A1C
Est. average glucose Bld gHb Est-mCnc: 131 mg/dL
Hgb A1c MFr Bld: 6.2 % — ABNORMAL HIGH (ref 4.8–5.6)

## 2020-09-07 LAB — COMPREHENSIVE METABOLIC PANEL
ALT: 17 IU/L (ref 0–32)
AST: 19 IU/L (ref 0–40)
Albumin/Globulin Ratio: 2.3 — ABNORMAL HIGH (ref 1.2–2.2)
Albumin: 4.8 g/dL — ABNORMAL HIGH (ref 3.7–4.7)
Alkaline Phosphatase: 89 IU/L (ref 44–121)
BUN/Creatinine Ratio: 18 (ref 12–28)
BUN: 14 mg/dL (ref 8–27)
Bilirubin Total: 0.4 mg/dL (ref 0.0–1.2)
CO2: 22 mmol/L (ref 20–29)
Calcium: 9.3 mg/dL (ref 8.7–10.3)
Chloride: 100 mmol/L (ref 96–106)
Creatinine, Ser: 0.8 mg/dL (ref 0.57–1.00)
Globulin, Total: 2.1 g/dL (ref 1.5–4.5)
Glucose: 109 mg/dL — ABNORMAL HIGH (ref 65–99)
Potassium: 4.1 mmol/L (ref 3.5–5.2)
Sodium: 140 mmol/L (ref 134–144)
Total Protein: 6.9 g/dL (ref 6.0–8.5)
eGFR: 77 mL/min/{1.73_m2} (ref >59)

## 2020-09-07 LAB — LIPID PANEL
Chol/HDL Ratio: 3.6 ratio (ref 0.0–4.4)
Cholesterol, Total: 198 mg/dL (ref 100–199)
HDL: 55 mg/dL (ref >39)
LDL Chol Calc (NIH): 120 mg/dL — ABNORMAL HIGH (ref 0–99)
Triglycerides: 128 mg/dL (ref 0–149)
VLDL Cholesterol Cal: 23 mg/dL (ref 5–40)

## 2020-09-07 LAB — VITAMIN D 25 HYDROXY (VIT D DEFICIENCY, FRACTURES): Vit D, 25-Hydroxy: 35.7 ng/mL (ref 30.0–100.0)

## 2020-09-07 LAB — CARDIOVASCULAR RISK ASSESSMENT

## 2020-09-08 ENCOUNTER — Encounter: Payer: Self-pay | Admitting: Family Medicine

## 2020-09-08 ENCOUNTER — Ambulatory Visit (INDEPENDENT_AMBULATORY_CARE_PROVIDER_SITE_OTHER): Payer: Medicare Other | Admitting: Family Medicine

## 2020-09-08 ENCOUNTER — Other Ambulatory Visit: Payer: Self-pay

## 2020-09-08 ENCOUNTER — Telehealth: Payer: Self-pay

## 2020-09-08 VITALS — BP 130/68 | HR 76 | Temp 97.4°F | Resp 14 | Ht 63.5 in | Wt 136.2 lb

## 2020-09-08 DIAGNOSIS — J00 Acute nasopharyngitis [common cold]: Secondary | ICD-10-CM

## 2020-09-08 DIAGNOSIS — R7303 Prediabetes: Secondary | ICD-10-CM

## 2020-09-08 DIAGNOSIS — J018 Other acute sinusitis: Secondary | ICD-10-CM | POA: Diagnosis not present

## 2020-09-08 DIAGNOSIS — E782 Mixed hyperlipidemia: Secondary | ICD-10-CM

## 2020-09-08 LAB — POC COVID19 BINAXNOW: SARS Coronavirus 2 Ag: NEGATIVE

## 2020-09-08 MED ORDER — AZITHROMYCIN 250 MG PO TABS
ORAL_TABLET | ORAL | 0 refills | Status: DC
Start: 2020-09-08 — End: 2020-12-15

## 2020-09-08 NOTE — Progress Notes (Signed)
Subjective:  Patient ID: Michelle Whitaker, female    DOB: December 27, 1945  Age: 75 y.o. MRN: [id number]  Chief Complaint  Patient presents with   Hyperlipidemia    HPI Hyperlipidemia: Discussed lab results. Cholesterol: TC 198, TC:128, HDL: 55, LDL:120. Prediabetic-6.2. Pt eats pretty healthy. Decreased exercise due to heat. 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  She has has complaints of nasal congestion, cough, and fatigue that has lasted one week. She has not attempted any medications for symptoms and denies being around any other person who is sick.    Current Outpatient Medications on File Prior to Visit  Medication Sig Dispense Refill   albuterol (VENTOLIN HFA) 108 (90 Base) MCG/ACT inhaler Inhale 2 puffs into the lungs every 6 (six) hours as needed for wheezing or shortness of breath. 1 each 2   aspirin 81 MG tablet Take 81 mg by mouth daily.     atorvastatin (LIPITOR) 10 MG tablet TAKE 1/2 TABLET BY MOUTH EVERY DAY 45 tablet 3   Cholecalciferol (VITAMIN D-3 PO) Take 800 Units by mouth daily.     CINNAMON PO Take 1,000 mg by mouth 2 (two) times daily.     fluticasone (FLONASE) 50 MCG/ACT nasal spray SPRAY 2 SPRAYS INTO EACH NOSTRIL EVERY DAY 48 mL 1   lisinopril (ZESTRIL) 10 MG tablet TAKE 1 TABLET BY MOUTH EVERY DAY 90 tablet 1   loratadine (CLARITIN) 10 MG tablet Take 10 mg by mouth daily.     verapamil (CALAN) 120 MG tablet TAKE 1 TABLET BY MOUTH TWICE A DAY 180 tablet 3   No current facility-administered medications on file prior to visit.   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   Past Surgical History:  Procedure Laterality Date   BREAST EXCISIONAL BIOPSY      BREAST SURGERY     right breast/benign   CATARACT EXTRACTION, BILATERAL     CERVICAL BIOPSY     benign   COLONOSCOPY     DILATION AND CURETTAGE OF UTERUS     due to heavy bleeding   herniated disc  1991   lower back   POLYPECTOMY      Family History  Problem Relation Age of Onset   Heart disease Mother    Stroke Father    Hypertension Sister    Dementia Sister    Colon cancer Neg Hx    Colon polyps Neg Hx    Esophageal cancer Neg Hx    Rectal cancer Neg Hx    Stomach cancer Neg Hx    Social History   Socioeconomic History   Marital status: Widowed    Spouse name: Not on file   Number of children: Not on file   Years of education: Not on file   Highest education level: Not on file  Occupational History   Not on file  Tobacco Use   Smoking status: Never   Smokeless tobacco: Never  Substance and Sexual Activity   Alcohol use: No   Drug use: No   Sexual activity: Not on  file  Other Topics Concern   Not on file  Social History Narrative   Not on file   Social Determinants of Health   Financial Resource Strain: Not on file  Food Insecurity: Not on file  Transportation Needs: Not on file  Physical Activity: Not on file  Stress: No Stress Concern Present   Feeling of Stress : Not at all  Social Connections: Not on file    Review of Systems  Constitutional:  Positive for fatigue. Negative for chills and fever.  HENT:  Positive for congestion. Negative for rhinorrhea and sore throat.   Respiratory:  Positive for cough. Negative for shortness of breath.   Cardiovascular:  Negative for chest pain.  Gastrointestinal:  Positive for diarrhea. Negative for abdominal pain, constipation, nausea and vomiting.  Genitourinary:  Negative for dysuria and urgency.  Musculoskeletal:  Positive for arthralgias, back pain and myalgias.  Neurological:  Negative for dizziness, weakness, light-headedness and headaches.  Psychiatric/Behavioral:  Negative for dysphoric mood. The  patient is not nervous/anxious.     Objective:  BP 130/68   Pulse 76   Temp (!) 97.4 F (36.3 C)   Resp 14   Ht 5' 3.5" (1.613 m)   Wt 136 lb 3.2 oz (61.8 kg)   BMI 23.75 kg/m   BP/Weight 09/08/2020 03/09/2020 1/61/0960  Systolic BP 454 098 119  Diastolic BP 68 70 72  Wt. (Lbs) 136.2 137 137.4  BMI 23.75 24.27 24.34    Physical Exam Vitals reviewed.  Constitutional:      Appearance: Normal appearance. She is normal weight.  HENT:     Nose: Nasal tenderness present.     Right Turbinates: Swollen.     Left Turbinates: Swollen.     Right Sinus: Frontal sinus tenderness present.     Left Sinus: Frontal sinus tenderness present.  Neck:     Vascular: No carotid bruit.  Cardiovascular:     Rate and Rhythm: Normal rate and regular rhythm.     Pulses: Normal pulses.     Heart sounds: Normal heart sounds.  Pulmonary:     Effort: Pulmonary effort is normal. No respiratory distress.     Breath sounds: Normal breath sounds.  Abdominal:     General: Abdomen is flat. Bowel sounds are normal.     Palpations: Abdomen is soft.     Tenderness: There is no abdominal tenderness.  Neurological:     Mental Status: She is alert and oriented to person, place, and time.  Psychiatric:        Mood and Affect: Mood normal.        Behavior: Behavior normal.    Diabetic Foot Exam - Simple   No data filed      Lab Results  Component Value Date   WBC 4.2 09/06/2020   HGB 14.4 09/06/2020   HCT 42.8 09/06/2020   PLT 195 09/06/2020   GLUCOSE 109 (H) 09/06/2020   CHOL 198 09/06/2020   TRIG 128 09/06/2020   HDL 55 09/06/2020   LDLCALC 120 (H) 09/06/2020   ALT 17 09/06/2020   AST 19 09/06/2020   NA 140 09/06/2020   K 4.1 09/06/2020   CL 100 09/06/2020   CREATININE 0.80 09/06/2020   BUN 14 09/06/2020   CO2 22 09/06/2020   TSH 2.600 03/09/2020   HGBA1C 6.2 (H) 09/06/2020      Assessment & Plan:   1. Mixed hyperlipidemia Labs reviewed Instructed to watch diet, increase  exercise. Continue current medications  2. Other sinusitis - POC COVID-19 BinaxNow negative.  Sent zpack.   3. Prediabetes  Watch diet and increase exercise.     Follow-up: Return in about 3 months (around 12/09/2020) for fasting.  An After Visit Summary was printed and given to the patient.  [name], MD [facility] [phone]

## 2020-09-08 NOTE — Progress Notes (Signed)
Chronic Care Management Pharmacy Assistant   Name: Michelle Whitaker  MRN: [id number] DOB: [birth year]  Michelle Whitaker is an 75 y.o. year old female who presents for his initial CCM visit with the clinical pharmacist.    Conditions to be addressed/monitored: HTN and HLD  Recent office visits:  09/08/20-Dr Michelle Whitaker PCP, hyperlipidemia, covid test, was given Z-pac, follow up 3 months   03/11/20-Michelle Heaton NP, video visit, Covid test, follow up PRN   03/09/20-Dr Michelle Whitaker PCP, labs ordered, Albuterol inhaler started, follow up 6 months  Lab Results:  Labs good except cholesterol jumped up a little. Recommend take lipitor 10 mgone whole tablet daily.  A1C is good.  03/02/20-Dr Michelle Whitaker PCP, annual exam, bone density ordered, no follow up noted.   Recent consult visits:  none  Hospital visits:  None in previous 6 months  Medications: Outpatient Encounter Medications as of 09/08/2020  Medication Sig   albuterol (VENTOLIN HFA) 108 (90 Base) MCG/ACT inhaler Inhale 2 puffs into the lungs every 6 (six) hours as needed for wheezing or shortness of breath.   aspirin 81 MG tablet Take 81 mg by mouth daily.   atorvastatin (LIPITOR) 10 MG tablet TAKE 1/2 TABLET BY MOUTH EVERY DAY   calcium carbonate 200 MG capsule Take 1,600 mg by mouth daily.   Cholecalciferol (VITAMIN D-3 PO) Take 800 Units by mouth daily.   CINNAMON PO Take 1,000 mg by mouth 2 (two) times daily.   fluticasone (FLONASE) 50 MCG/ACT nasal spray SPRAY 2 SPRAYS INTO EACH NOSTRIL EVERY DAY   lisinopril (ZESTRIL) 10 MG tablet TAKE 1 TABLET BY MOUTH EVERY DAY   loratadine (CLARITIN) 10 MG tablet Take 10 mg by mouth daily.   verapamil (CALAN) 120 MG tablet TAKE 1 TABLET BY MOUTH TWICE A DAY   No facility-administered encounter medications on file as of 09/08/2020.    Lab Results  Component Value Date/Time   HGBA1C 6.2 (H) 09/06/2020 01:47 PM   HGBA1C 6.0 (H) 03/09/2020 09:25 AM     BP Readings from Last 3 Encounters:  03/09/20 118/70   03/02/20 134/72  09/03/19 124/64      Have you seen any other providers since your last visit with PCP? No  Any changes in your medications or health? Yes, she stated she was on a Zpac and 3 days into the medication she had stomach cramps and diarrhea and stopped the medication   Any side effects from any medications? Yes, from Z-pac only  Do you have an symptoms or problems not managed by your medications? No  Any concerns about your health right now? No  Has your provider asked that you check blood pressure, blood sugar, or follow special diet at home? Patient does check her blood pressures and blood sugars on occasion, she stated that her diet has improved since her last blood work results came in    Do you get any type of exercise on a regular basis? Patient stays active, she volunteers at zoo and soup kitchen, and works in yard,   Can you think of a goal you would like to reach for your health? No  Do you have any problems getting your medications? No  Is there anything that you would like to discuss during the appointment? No    Star Rating Drugs:  Medication:  Last Fill: Day Supply Atorvastatin  06/28/20  90 Lisinopril  07/23/20  90   Care Gaps: Last annual wellness visit?  3/61/44 If applicable: Last eye exam /  retinopathy screening? None listed  Last diabetic foot exam? None listed   Michelle Whitaker, Michelle Whitaker Pharmacist Assistant [phone]

## 2020-09-09 ENCOUNTER — Encounter: Payer: Self-pay | Admitting: Family Medicine

## 2020-09-09 NOTE — Progress Notes (Addendum)
Chronic Care Management Pharmacy Note  09/16/2020 Name:  Michelle Whitaker MRN:  [id number] DOB:  Nov 08, 1945  Summary: We discussed benefits of medication for osteoporosis. Patient declines due to sensitivity to medications. Discussed importance of fall prevention, consuming calcium-rich and regular weight-bearing exercise.  Patient reports atorvastatin 5 mg is her maximum tolerated dose of statin. She reports having tried many others in the past. She is diligently working on her diet and exercise to improve cholesterol with next check.    Subjective: Michelle Whitaker is an 75 y.o. year old female who is a primary patient of Demario, Kirsten, MD.  The CCM team was consulted for assistance with disease management and care coordination needs.    Engaged with patient face to face for initial visit in response to provider referral for pharmacy case management and/or care coordination services.   Consent to Services:  The patient was given the following information about Chronic Care Management services today, agreed to services, and gave verbal consent: 1. CCM service includes personalized support from designated clinical staff supervised by the primary care provider, including individualized plan of care and coordination with other care providers 2. 24/7 contact phone numbers for assistance for urgent and routine care needs. 3. Service will only be billed when office clinical staff spend 20 minutes or more in a month to coordinate care. 4. Only one practitioner may furnish and bill the service in a calendar month. 5.The patient may stop CCM services at any time (effective at the end of the month) by phone call to the office staff. 6. The patient will be responsible for cost sharing (co-pay) of up to 20% of the service fee (after annual deductible is met). Patient agreed to services and consent obtained.  Patient Care Team: [name], MD as PCP - General (Family Medicine) Burnice Logan, [facility] as Pharmacist  (Pharmacist)   Recent office visits:  03/11/20-Shannon Heaton NP, video visit, Covid test, follow up PRN   03/09/20-Dr Metzger PCP, labs ordered, Albuterol inhaler started, follow up 6 months Lab Results:  Labs good except cholesterol jumped up a little. Recommend take lipitor 10 mgone whole tablet daily. A1C is good.   03/02/20-Dr Carnathan PCP, annual exam, bone density ordered, no follow up noted.    Recent consult visits:  none   Objective:  Lab Results  Component Value Date   CREATININE 0.80 09/06/2020   BUN 14 09/06/2020   GFRNONAA 77 03/09/2020   GFRAA 89 03/09/2020   NA 140 09/06/2020   K 4.1 09/06/2020   CALCIUM 9.3 09/06/2020   CO2 22 09/06/2020   GLUCOSE 109 (H) 09/06/2020    Lab Results  Component Value Date/Time   HGBA1C 6.2 (H) 09/06/2020 01:47 PM   HGBA1C 6.0 (H) 03/09/2020 09:25 AM    Last diabetic Eye exam: No results found for: HMDIABEYEEXA  Last diabetic Foot exam: No results found for: HMDIABFOOTEX   Lab Results  Component Value Date   CHOL 198 09/06/2020   HDL 55 09/06/2020   LDLCALC 120 (H) 09/06/2020   TRIG 128 09/06/2020   CHOLHDL 3.6 09/06/2020    Hepatic Function Latest Ref Rng & Units 09/06/2020 03/09/2020 09/01/2019  Total Protein 6.0 - 8.5 g/dL 6.9 7.0 6.8  Albumin 3.7 - 4.7 g/dL 4.8(H) 4.6 4.6  AST 0 - 40 IU/L _0 ALT 0 - 32 IU/L _1 Alk Phosphatase 44 - 121 IU/L 89 111 96  Total Bilirubin 0.0 - 1.2 mg/dL 0.4  0.6 0.7    Lab Results  Component Value Date/Time   TSH 2.600 03/09/2020 09:25 AM    CBC Latest Ref Rng & Units 09/06/2020 03/09/2020 09/01/2019  WBC 3.4 - 10.8 x10E3/uL 4.2 4.5 4.1  Hemoglobin 11.1 - 15.9 g/dL 14.4 14.6 14.5  Hematocrit 34.0 - 46.6 % 42.8 42.7 43.4  Platelets 150 - 450 x10E3/uL 195 211 206    Lab Results  Component Value Date/Time   VD25OH 35.7 09/06/2020 01:47 PM   VD25OH 28.2 (L) 09/01/2019 07:52 AM    Clinical ASCVD: No  The 10-year ASCVD risk score Mikey Bussing DC Jr., et al., 2013) is: 21.6%    Values used to calculate the score:     Age: 53 years     Sex: Female     Is Non-Hispanic African American: No     Diabetic: No     Tobacco smoker: No     Systolic Blood Pressure: 735 mmHg     Is BP treated: Yes     HDL Cholesterol: 55 mg/dL     Total Cholesterol: 198 mg/dL    Depression screen [facility] 2/9 09/08/2020 03/09/2020 03/02/2020  Decreased Interest 0 0 0  Down, Depressed, Hopeless 0 0 0  PHQ - 2 Score 0 0 0     Other: (CHADS2VASc if Afib, MMRC or CAT for COPD, ACT, DEXA)  Social History   Tobacco Use  Smoking Status Never  Smokeless Tobacco Never   BP Readings from Last 3 Encounters:  09/08/20 130/68  03/09/20 118/70  03/02/20 134/72   Pulse Readings from Last 3 Encounters:  09/08/20 76  03/09/20 78  03/02/20 72   Wt Readings from Last 3 Encounters:  09/08/20 136 lb 3.2 oz (61.8 kg)  03/09/20 137 lb (62.1 kg)  03/02/20 137 lb 6.4 oz (62.3 kg)   BMI Readings from Last 3 Encounters:  09/08/20 23.75 kg/m  03/09/20 24.27 kg/m  03/02/20 24.34 kg/m    Assessment/Interventions: Review of patient past medical history, allergies, medications, health status, including review of consultants reports, laboratory and other test data, was performed as part of comprehensive evaluation and provision of chronic care management services.   SDOH:  (Social Determinants of Health) assessments and interventions performed: Yes  SDOH Screenings   Alcohol Screen: Low Risk    Last Alcohol Screening Score (AUDIT): 0  Depression (PHQ2-9): Low Risk    PHQ-2 Score: 0  Financial Resource Strain: Not on file  Food Insecurity: No Food Insecurity   Worried About Charity fundraiser in the Last Year: Never true   Ran Out of Food in the Last Year: Never true  Housing: [place] Risk Score: 0  Physical Activity: Not on file  Social Connections: Not on file  Stress: No Stress Concern Present   Feeling of Stress : Not at all  Tobacco Use: Low Risk    Smoking Tobacco  Use: Never   Smokeless Tobacco Use: Never  Transportation Needs: Not on file    CCM Care Plan  Allergies  Allergen Reactions   Atenolol    Codeine    Erythromycin     REACTION: Nausea   Morphine     REACTION: nausea/anxiety   Penicillins    Statins     Causes numbness side face    Medications Reviewed Today     Reviewed by Burnice Logan, [facility] (Pharmacist) on 09/15/20 at 1446  Med List Status: <None>   Medication Order Taking? Sig Documenting  Provider Last Dose Status Informant  albuterol (VENTOLIN HFA) 108 (90 Base) MCG/ACT inhaler [id number] Yes Inhale 2 puffs into the lungs every 6 (six) hours as needed for wheezing or shortness of breath. CoxElnita Maxwell, MD Taking Active   aspirin 81 MG tablet [id number] Yes Take 81 mg by mouth daily. [provider] Taking Active   atorvastatin (LIPITOR) 10 MG tablet [id number] Yes TAKE 1/2 TABLET BY MOUTH EVERY DAY Marge Duncans, PA-C Taking Active   azithromycin (ZITHROMAX) 250 MG tablet [id number] No 2 DAILY FOR FIRST DAY, THEN DECREASE TO ONE DAILY FOR 4 MORE DAYS.  Patient not taking: Reported on 09/15/2020   CoxElnita Maxwell, MD Not Taking Consider Medication Status and Discontinue   Cholecalciferol (VITAMIN D-3 PO) [id number] Yes Take 800 Units by mouth daily. [provider] Taking Active   CINNAMON PO [id number] Yes Take 1,000 mg by mouth 2 (two) times daily. [provider] Taking Active   fluticasone (FLONASE) 50 MCG/ACT nasal spray [id number] Yes SPRAY 2 SPRAYS INTO EACH NOSTRIL EVERY DAY Riedl, Kirsten, MD Taking Active   lisinopril (ZESTRIL) 10 MG tablet [id number] Yes TAKE 1 TABLET BY MOUTH EVERY DAY Rolon, Kirsten, MD Taking Active   loratadine (CLARITIN) 10 MG tablet [id number] Yes Take 10 mg by mouth daily. [provider] Taking Active   verapamil (CALAN) 120 MG tablet [id number] Yes TAKE 1 TABLET BY MOUTH TWICE A Luster Landsberg, MD Taking Active             Patient Active Problem List   Diagnosis Date  Noted   Other osteoporosis without current pathological fracture  09/03/2019   Hyperlipidemia 09/03/2019   Hypertension 09/03/2019   Acute vaginitis 09/03/2019   Asthma 12/27/2016    Immunization History  Administered Date(s) Administered   Fluad Quad(high Dose 65+) 11/03/2019   Moderna Sars-Covid-2 Vaccination 03/28/2019, 04/23/2019, 12/19/2019   Pneumococcal Conjugate-13 01/24/2014   Pneumococcal Polysaccharide-23 12/02/2010   Tdap 02/21/2015    Conditions to be addressed/monitored:  Hypertension, Hyperlipidemia, Asthma, and Osteoporosis  Care Plan : [place]  Updates made by Burnice Logan, [place] since 09/16/2020 12:00 AM     Problem: htn, hld, osteoporosis   Priority: High  Onset Date: 09/15/2020     Long-Range Goal: Disease State Management   Start Date: 09/16/2020  Expected End Date: 09/16/2021  This Visit's Progress: On track  Priority: High  Note:   Current Barriers:  Suboptimal therapeutic regimen for cholesterol and osteoporosis due to history with significant side effects.   Pharmacist Clinical Goal(s):  Patient will adhere to plan to optimize therapeutic regimen for cholesterol and osteoporosis with lifestyle modifications  as evidenced by report of adherence to recommended medication management changes through collaboration with PharmD and provider.   Interventions: 1:1 collaboration with Simerly, Kirsten, MD regarding development and update of comprehensive plan of care as evidenced by provider attestation and co-signature Inter-disciplinary care team collaboration (see longitudinal plan of care) Comprehensive medication review performed; medication list updated in electronic medical record  Hypertension (BP goal <140/90) -Controlled -Current treatment: Lisinopril 10 mg daily  Verapamil 120 mg bid  -Medications previously tried: atenolol  -Current home readings: well controlled when checking -Current dietary habits: eats a variety of fruits and  vegetables.  -Current exercise habits: Stays very active and enjoys walking. Keeps up her yard. Enjoys walking rail trails locally.  -Denies hypotensive/hypertensive symptoms -Educated on BP goals and benefits of medications for prevention of heart attack, stroke and kidney damage; Daily salt intake  goal < 2300 mg; Exercise goal of 150 minutes per week; Symptoms of hypotension and importance of maintaining adequate hydration; -Counseled to monitor BP at home as needed, document, and provide log at future appointments -Counseled on diet and exercise extensively Recommended to continue current medication  Hyperlipidemia: (LDL goal < 100) -Not ideally controlled -Current treatment: aspirin 81 mg daily  Atorvastatin 10 mg 1/2 tablet daily  -Medications previously tried: tried various statins and unable to tolerate. Max dose tolerated is 5 mg daily of atorvastatin.   -Current dietary habits: eats a variety of fruits and vegetables.  -Current exercise habits: Stays very active and enjoys walking. Keeps up her yard. Enjoys walking rail trails locally.  -Educated on Cholesterol goals;  Benefits of statin for ASCVD risk reduction; Importance of limiting foods high in cholesterol; Exercise goal of 150 minutes per week; -Counseled on diet and exercise extensively Recommended to continue current medication Educated on benefits of lean protein sources and vegetables.   Osteoporosis / Osteopenia (Goal reduce risk of fracture) -Uncontrolled -Last DEXA Scan: 04/09/2020  T-Score lumbar spine:  -3.3  T-Score forearm radius: -2.4  -Patient is a candidate for pharmacologic treatment due to T-Score < -2.5 in lumbar spine -Current treatment  vitamin d 800 units daily (patient resumed after lower vitamin d result with labs this month) Calcium - Unable to tolerates calcium supplements due to makes GI intolerance. She eats broccoli,cabbage, green vegetables. Doesn't drink a lot of milk. Has tried various  forms of calcium without success.  -Medications previously tried:  calcium, patient refuse fosamax and prolia due to side effects -Recommend [phone] units of vitamin D daily. Recommend 1200 mg of calcium daily from dietary and supplemental sources. Recommend weight-bearing and muscle strengthening exercises for building and maintaining bone density. -Counseled on diet and exercise extensively Recommended to continue current medication Counseled on importance of fall prevention, exercise and incorporating calcium rich foods in diet since patient unable to tolerate calcium supplement and concerned with adverse effects of Prolia/Fosamax.   Prediabetes (Goal: manage blood sugar and prevent diabetes) -Controlled - Patient reports home fasting blood sugar readings are 109-113. She checks intermittently to assess control.  -Current treatment  Diet/lifestyle -Medications previously tried: none reported  -Counseled on diet and exercise extensively Educated on benefits of patient's regular exercise. Patient is watching carbohydrate/sugar intake to improve next a1c reading. Patient states she has not had a sweet treat since her blood work results last week. Pharmacist educated patient on the benefits of her activity and healthy diet.   Asthma (Goal: prevent exacerbation) -Controlled -Current treatment  Albuterol 2 puffs into lungs every 6 hours prn wheezing/shortness of breath Flonase nasal spray 50 mcg/act 2 sprays each nostril daily  Loratadine 10 mg daily  -Medications previously tried: none reported  -Recommended to continue current medication Discussed patient's triggers and importance of avoiding triggers and taking flonase and loratadine preventively.   Health Maintenance -Vaccine gaps: Recommend Shingrix -Current therapy:  cinnamon 1000 mg bid  -Educated on Cost vs benefit of each product must be carefully weighed by individual consumer -Patient is satisfied with current therapy and  denies issues -Counseled on diet and exercise extensively Recommended to continue current medication  Patient Goals/Self-Care Activities Patient will:  - take medications as prescribed focus on medication adherence by using pill box target a minimum of 150 minutes of moderate intensity exercise weekly engage in dietary modifications by continuing to eat fruits, vegetables, lean protein and calcium rich foods.   Follow Up Plan: Telephone  follow up appointment with care management team member scheduled for: as needed per patient      Medication Assistance: None required.  Patient affirms current coverage meets needs.  Star Rating Drugs:  Medication:                Last Fill:         Day Supply Atorvastatin                 06/28/20              90 Lisinopril                      07/23/20              90     Care Gaps: Last annual wellness visit?  08/28/60 If applicable: Last eye exam / retinopathy screening? None listed  Last diabetic foot exam? None listed   Patient's preferred pharmacy is:  [id number] [id number]: [phone] Fax: [phone] Uses pill box? Yes Pt endorses 100% compliance  We discussed: Current pharmacy is preferred with insurance plan and patient is satisfied with pharmacy services Patient decided to: Continue current medication management strategy  Care Plan and Follow Up Patient Decision:  Patient agrees to Care Plan and Follow-up.  Plan: Telephone follow up appointment with care management team member scheduled for:  as needed per patient request

## 2020-09-15 ENCOUNTER — Other Ambulatory Visit: Payer: Self-pay | Admitting: Physician Assistant

## 2020-09-15 ENCOUNTER — Other Ambulatory Visit: Payer: Self-pay

## 2020-09-15 ENCOUNTER — Ambulatory Visit (INDEPENDENT_AMBULATORY_CARE_PROVIDER_SITE_OTHER): Payer: Medicare Other

## 2020-09-15 DIAGNOSIS — R7303 Prediabetes: Secondary | ICD-10-CM

## 2020-09-15 DIAGNOSIS — I10 Essential (primary) hypertension: Secondary | ICD-10-CM | POA: Diagnosis not present

## 2020-09-15 DIAGNOSIS — M818 Other osteoporosis without current pathological fracture: Secondary | ICD-10-CM

## 2020-09-15 DIAGNOSIS — E782 Mixed hyperlipidemia: Secondary | ICD-10-CM | POA: Diagnosis not present

## 2020-09-16 NOTE — Patient Instructions (Signed)
Visit Information  Thank you for your time discussing your medications. I look forward to working with you to achieve your health care goals. Below is a summary of what we talked about during our visit.    Goals Addressed             This Visit's Progress    Manage My Medicine       Timeframe:  Long-Range Goal Priority:  High Start Date:                             Expected End Date:                       Follow Up Date as needed    - call for medicine refill 2 or 3 days before it runs out - keep a list of all the medicines I take; vitamins and herbals too - use a pillbox to sort medicine    Why is this important?   These steps will help you keep on track with your medicines.   Notes:      Prevent Falls and Broken Bones-Osteoporosis       Timeframe:  Long-Range Goal Priority:  High Start Date:                             Expected End Date:                       Follow Up Date 08/2021    - get at least 10 minutes of activity every day - keep cell phone with me always    Why is this important?   When you fall, there are 3 things that control if a bone breaks or not.  These are the fall itself, how hard and the direction that you fall and how fragile your bones are.  Preventing falls is very important for you because of fragile bones.     Notes:      Track and Manage My Blood Pressure-Hypertension       Timeframe:  Long-Range Goal Priority:  High Start Date:                             Expected End Date:                       Follow Up Date 08/2021    - write blood pressure results in a log or diary    Why is this important?   You won't feel high blood pressure, but it can still hurt your blood vessels.  High blood pressure can cause heart or kidney problems. It can also cause a stroke.  Making lifestyle changes like losing a little weight or eating less salt will help.  Checking your blood pressure at home and at different times of the day can help to control  blood pressure.  If the doctor prescribes medicine remember to take it the way the doctor ordered.  Call the office if you cannot afford the medicine or if there are questions about it.     Notes:         Patient Care Plan: CCM Pharmacy Care Plan     Problem Identified: htn, hld, osteoporosis   Priority: High  Onset Date: 09/15/2020  Long-Range Goal: Disease State Management   Start Date: 09/16/2020  Expected End Date: 09/16/2021  This Visit's Progress: On track  Priority: High       Ms. Dowding was given information about Chronic Care Management services today including:  CCM service includes personalized support from designated clinical staff supervised by her physician, including individualized plan of care and coordination with other care providers 24/7 contact phone numbers for assistance for urgent and routine care needs. Standard insurance, coinsurance, copays and deductibles apply for chronic care management only during months in which we provide at least 20 minutes of these services. Most insurances cover these services at 100%, however patients may be responsible for any copay, coinsurance and/or deductible if applicable. This service may help you avoid the need for more expensive face-to-face services. Only one practitioner may furnish and bill the service in a calendar month. The patient may stop CCM services at any time (effective at the end of the month) by phone call to the office staff.  Patient agreed to services and verbal consent obtained.   Patient verbalizes understanding of instructions provided today and agrees to view in [place].  Telephone follow up appointment with pharmacy team member scheduled for: as needed per patient request  Sherre Poot, PharmD Clinical Pharmacist [place] [phone] (office) [phone] (mobile)

## 2020-09-20 DIAGNOSIS — H26493 Other secondary cataract, bilateral: Secondary | ICD-10-CM | POA: Diagnosis not present

## 2020-11-16 ENCOUNTER — Encounter: Payer: Self-pay | Admitting: Family Medicine

## 2020-11-16 ENCOUNTER — Ambulatory Visit (INDEPENDENT_AMBULATORY_CARE_PROVIDER_SITE_OTHER): Payer: Medicare Other

## 2020-11-16 DIAGNOSIS — Z23 Encounter for immunization: Secondary | ICD-10-CM

## 2020-12-12 ENCOUNTER — Other Ambulatory Visit: Payer: Self-pay | Admitting: Family Medicine

## 2020-12-13 ENCOUNTER — Ambulatory Visit: Payer: Medicare Other

## 2020-12-13 DIAGNOSIS — R7303 Prediabetes: Secondary | ICD-10-CM

## 2020-12-13 DIAGNOSIS — I10 Essential (primary) hypertension: Secondary | ICD-10-CM | POA: Diagnosis not present

## 2020-12-13 DIAGNOSIS — E782 Mixed hyperlipidemia: Secondary | ICD-10-CM | POA: Diagnosis not present

## 2020-12-13 NOTE — Telephone Encounter (Signed)
Refill sent to pharmacy.   

## 2020-12-14 ENCOUNTER — Encounter: Payer: Self-pay | Admitting: Family Medicine

## 2020-12-14 LAB — COMPREHENSIVE METABOLIC PANEL
ALT: 15 IU/L (ref 0–32)
AST: 17 IU/L (ref 0–40)
Albumin/Globulin Ratio: 1.8 (ref 1.2–2.2)
Albumin: 4.4 g/dL (ref 3.7–4.7)
Alkaline Phosphatase: 106 IU/L (ref 44–121)
BUN/Creatinine Ratio: 19 (ref 12–28)
BUN: 15 mg/dL (ref 8–27)
Bilirubin Total: 0.7 mg/dL (ref 0.0–1.2)
CO2: 24 mmol/L (ref 20–29)
Calcium: 9.5 mg/dL (ref 8.7–10.3)
Chloride: 103 mmol/L (ref 96–106)
Creatinine, Ser: 0.8 mg/dL (ref 0.57–1.00)
Globulin, Total: 2.4 g/dL (ref 1.5–4.5)
Glucose: 105 mg/dL — ABNORMAL HIGH (ref 70–99)
Potassium: 4.1 mmol/L (ref 3.5–5.2)
Sodium: 141 mmol/L (ref 134–144)
Total Protein: 6.8 g/dL (ref 6.0–8.5)
eGFR: 76 mL/min/{1.73_m2} (ref >59)

## 2020-12-14 LAB — LIPID PANEL
Chol/HDL Ratio: 3.6 ratio (ref 0.0–4.4)
Cholesterol, Total: 192 mg/dL (ref 100–199)
HDL: 53 mg/dL (ref >39)
LDL Chol Calc (NIH): 105 mg/dL — ABNORMAL HIGH (ref 0–99)
Triglycerides: 199 mg/dL — ABNORMAL HIGH (ref 0–149)
VLDL Cholesterol Cal: 34 mg/dL (ref 5–40)

## 2020-12-14 LAB — CBC WITH DIFFERENTIAL/PLATELET
Basophils Absolute: 0 10*3/uL (ref 0.0–0.2)
Basos: 1 %
EOS (ABSOLUTE): 0.1 10*3/uL (ref 0.0–0.4)
Eos: 2 %
Hematocrit: 43.9 % (ref 34.0–46.6)
Hemoglobin: 14.7 g/dL (ref 11.1–15.9)
Immature Grans (Abs): 0 10*3/uL (ref 0.0–0.1)
Immature Granulocytes: 0 %
Lymphocytes Absolute: 1.6 10*3/uL (ref 0.7–3.1)
Lymphs: 39 %
MCH: 30 pg (ref 26.6–33.0)
MCHC: 33.5 g/dL (ref 31.5–35.7)
MCV: 90 fL (ref 79–97)
Monocytes Absolute: 0.4 10*3/uL (ref 0.1–0.9)
Monocytes: 9 %
Neutrophils Absolute: 2 10*3/uL (ref 1.4–7.0)
Neutrophils: 49 %
Platelets: 210 10*3/uL (ref 150–450)
RBC: 4.9 x10E6/uL (ref 3.77–5.28)
RDW: 12.8 % (ref 11.7–15.4)
WBC: 4.1 10*3/uL (ref 3.4–10.8)

## 2020-12-14 LAB — CARDIOVASCULAR RISK ASSESSMENT

## 2020-12-14 LAB — HEMOGLOBIN A1C
Est. average glucose Bld gHb Est-mCnc: 128 mg/dL
Hgb A1c MFr Bld: 6.1 % — ABNORMAL HIGH (ref 4.8–5.6)

## 2020-12-14 NOTE — Progress Notes (Signed)
Blood count normal.  Liver function normal.  Kidney function normal.  Cholesterol: Trigs up. LDL up a little.  HBA1C: 6.1. stable. Keep appt later this week.

## 2020-12-15 ENCOUNTER — Ambulatory Visit (INDEPENDENT_AMBULATORY_CARE_PROVIDER_SITE_OTHER): Payer: Medicare Other | Admitting: Family Medicine

## 2020-12-15 ENCOUNTER — Other Ambulatory Visit: Payer: Self-pay

## 2020-12-15 ENCOUNTER — Encounter: Payer: Self-pay | Admitting: Family Medicine

## 2020-12-15 VITALS — BP 134/72 | HR 64 | Temp 96.6°F | Ht 64.0 in | Wt 134.0 lb

## 2020-12-15 DIAGNOSIS — R7303 Prediabetes: Secondary | ICD-10-CM | POA: Diagnosis not present

## 2020-12-15 DIAGNOSIS — E782 Mixed hyperlipidemia: Secondary | ICD-10-CM | POA: Diagnosis not present

## 2020-12-15 DIAGNOSIS — I10 Essential (primary) hypertension: Secondary | ICD-10-CM | POA: Diagnosis not present

## 2020-12-15 NOTE — Progress Notes (Signed)
Subjective:  Patient ID: Michelle Whitaker, female    DOB: 01-27-1946  Age: 75 y.o. MRN: [id number]  Chief Complaint  Patient presents with   Hypertension   Hyperlipidemia    Hypertension Pertinent negatives include no chest pain, headaches, palpitations or shortness of breath.  Hyperlipidemia Pertinent negatives include no chest pain, myalgias or shortness of breath.    Pre- Diabetes:  Most recent A1C: 6.1.  Hyperlipidemia: Trigs 199 and LDL 105.  Current medications: lipitor 10 mg once daily  Hypertension: Current medications: Calan 120 mg one twice a day and lisinopril 10 mg qd.   CBC, CMP normal. Diet: not as healthy as was.  Exercise: not as much. She would like to work on this.    Current Outpatient Medications on File Prior to Visit  Medication Sig Dispense Refill   albuterol (VENTOLIN HFA) 108 (90 Base) MCG/ACT inhaler Inhale 2 puffs into the lungs every 6 (six) hours as needed for wheezing or shortness of breath. 1 each 2   aspirin 81 MG tablet Take 81 mg by mouth daily.     atorvastatin (LIPITOR) 10 MG tablet TAKE 1/2 TABLET BY MOUTH EVERY DAY 45 tablet 3   Cholecalciferol (VITAMIN D-3 PO) Take 800 Units by mouth daily.     CINNAMON PO Take 1,000 mg by mouth 2 (two) times daily.     fluticasone (FLONASE) 50 MCG/ACT nasal spray SPRAY 2 SPRAYS INTO EACH NOSTRIL EVERY DAY 48 mL 1   lisinopril (ZESTRIL) 10 MG tablet TAKE 1 TABLET BY MOUTH EVERY DAY 90 tablet 1   loratadine (CLARITIN) 10 MG tablet Take 10 mg by mouth daily.     verapamil (CALAN) 120 MG tablet TAKE 1 TABLET BY MOUTH TWICE A DAY 180 tablet 3   No current facility-administered medications on file prior to visit.   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   Past Surgical History:  Procedure Laterality Date   BREAST EXCISIONAL BIOPSY     BREAST SURGERY     right breast/benign   CATARACT EXTRACTION, BILATERAL     CERVICAL BIOPSY     benign   COLONOSCOPY     DILATION AND CURETTAGE OF UTERUS     due to heavy bleeding   herniated disc  1991   lower back   POLYPECTOMY      Family History  Problem Relation Age of Onset   Heart disease Mother    Stroke Father    Hypertension Sister    Dementia Sister    Colon cancer Neg Hx    Colon polyps Neg Hx    Esophageal cancer Neg Hx    Rectal cancer Neg Hx    Stomach cancer Neg Hx    Social History   Socioeconomic History   Marital status: Widowed    Spouse name: Not on file   Number of children: Not on file   Years of education: Not on file   Highest education level: Not on file  Occupational History   Not on file  Tobacco Use   Smoking status:  Never   Smokeless tobacco: Never  Substance and Sexual Activity   Alcohol use: No   Drug use: No   Sexual activity: Not on file  Other Topics Concern   Not on file  Social History Narrative   Not on file   Social Determinants of Health   Financial Resource Strain: Not on file  Food Insecurity: No Food Insecurity   Worried About Running Out of Food in the Last Year: Never true   Ran Out of Food in the Last Year: Never true  Transportation Needs: Not on file  Physical Activity: Not on file  Stress: No Stress Concern Present   Feeling of Stress : Not at all  Social Connections: Not on file    Review of Systems  Constitutional:  Negative for appetite change, fatigue and fever.  HENT:  Negative for congestion, ear pain, sinus pressure and sore throat.   Eyes:  Negative for pain.  Respiratory:  Negative for cough, chest tightness, shortness of breath and wheezing.   Cardiovascular:  Negative for chest pain and palpitations.  Gastrointestinal:  Negative for abdominal pain,  constipation, diarrhea, nausea and vomiting.  Genitourinary:  Negative for dysuria and hematuria.  Musculoskeletal:  Negative for arthralgias, back pain, joint swelling and myalgias.  Skin:  Negative for rash.  Neurological:  Negative for dizziness, weakness and headaches.  Psychiatric/Behavioral:  Negative for dysphoric mood. The patient is not nervous/anxious.     Objective:  BP 134/72 (BP Location: Right Arm, Patient Position: Sitting)   Pulse 64   Temp (!) 96.6 F (35.9 C) (Temporal)   Ht 5\' 4"  (1.626 m)   Wt 134 lb (60.8 kg)   SpO2 98%   BMI 23.00 kg/m   BP/Weight 12/15/2020 09/08/2020 1/74/9449  Systolic BP 675 916 384  Diastolic BP 72 68 70  Wt. (Lbs) 134 136.2 137  BMI 23 23.75 24.27    Physical Exam Vitals reviewed.  Constitutional:      Appearance: Normal appearance. She is normal weight.  Neck:     Vascular: No carotid bruit.  Cardiovascular:     Rate and Rhythm: Normal rate and regular rhythm.     Heart sounds: Normal heart sounds.  Pulmonary:     Effort: Pulmonary effort is normal. No respiratory distress.     Breath sounds: Normal breath sounds.  Abdominal:     General: Abdomen is flat. Bowel sounds are normal.     Palpations: Abdomen is soft.     Tenderness: There is no abdominal tenderness.  Neurological:     Mental Status: She is alert and oriented to person, place, and time.  Psychiatric:        Mood and Affect: Mood normal.        Behavior: Behavior normal.    Diabetic Foot Exam - Simple   No data filed      Lab Results  Component Value Date   WBC 4.1 12/13/2020   HGB 14.7 12/13/2020   HCT 43.9 12/13/2020   PLT 210 12/13/2020   GLUCOSE 105 (H) 12/13/2020   CHOL 192 12/13/2020   TRIG 199 (H) 12/13/2020   HDL 53 12/13/2020   LDLCALC 105 (H) 12/13/2020   ALT 15 12/13/2020   AST 17 12/13/2020   NA 141 12/13/2020   K 4.1 12/13/2020   CL 103 12/13/2020   CREATININE 0.80 12/13/2020   BUN 15 12/13/2020   CO2 24 12/13/2020   TSH 2.600  03/09/2020   HGBA1C 6.1 (H) 12/13/2020  Assessment & Plan:   Problem List Items Addressed This Visit       Cardiovascular and Mediastinum   Essential hypertension, benign - Primary    The current medical regimen is effective;  continue present plan and medications. Continue Calan 120 mg one twice a day and lisinopril 10 mg qd.          Other   Hyperlipidemia    Not quite at goal.  Continue lipitor 10 mg once daily and aspirin 81 mg daily. Low fat diet and exercise.       Prediabetes    Recommend continue to work on eating healthy diet and exercise.      .  Follow-up: Return in about 6 months (around 06/15/2021) for  chronic fasting, awv after 03/03/2021 with Maudie Mercury, LPN,.  An After Visit Summary was printed and given to the patient.  I,Lauren M Auman,acting as a scribe for [name], MD.,have documented all relevant documentation on the behalf of [name], MD,as directed by  [name], MD while in the presence of [name], MD.    [name], MD [place] [phone]

## 2020-12-20 NOTE — Assessment & Plan Note (Addendum)
Not quite at goal.  Continue lipitor 10 mg once daily and aspirin 81 mg daily. Low fat diet and exercise.

## 2020-12-20 NOTE — Assessment & Plan Note (Signed)
The current medical regimen is effective;  continue present plan and medications. Continue Calan 120 mg one twice a day and lisinopril 10 mg qd.

## 2020-12-20 NOTE — Assessment & Plan Note (Signed)
Recommend continue to work on eating healthy diet and exercise.  

## 2021-01-25 ENCOUNTER — Other Ambulatory Visit: Payer: Self-pay

## 2021-01-25 DIAGNOSIS — Z1231 Encounter for screening mammogram for malignant neoplasm of breast: Secondary | ICD-10-CM

## 2021-02-14 ENCOUNTER — Other Ambulatory Visit: Payer: Self-pay | Admitting: Family Medicine

## 2021-03-09 ENCOUNTER — Ambulatory Visit (INDEPENDENT_AMBULATORY_CARE_PROVIDER_SITE_OTHER): Payer: Medicare Other

## 2021-03-09 ENCOUNTER — Other Ambulatory Visit: Payer: Self-pay

## 2021-03-09 VITALS — BP 138/74 | HR 65 | Resp 16 | Ht 64.0 in | Wt 139.2 lb

## 2021-03-09 DIAGNOSIS — Z0000 Encounter for general adult medical examination without abnormal findings: Secondary | ICD-10-CM

## 2021-03-09 DIAGNOSIS — Z6823 Body mass index (BMI) 23.0-23.9, adult: Secondary | ICD-10-CM

## 2021-03-09 NOTE — Patient Instructions (Signed)

## 2021-03-09 NOTE — Progress Notes (Signed)
Subjective:   Michelle Whitaker is a 76 y.o. female who presents for Medicare Annual (Subsequent) preventive examination.  This wellness visit is conducted by a nurse.  The patient's medications were reviewed and reconciled since the patient's last visit.  History details were provided by the patient.  The history appears to be reliable.    Patient's last AWV was one year ago.   Medical History: Patient history and Family history was reviewed  Medications, Allergies, and preventative health maintenance was reviewed and updated.   Review of Systems    ROS-Negative Cardiac Risk Factors include: advanced age (>66men, >33 women);dyslipidemia     Objective:    Today's Vitals   03/09/21 0949  BP: 138/74  Pulse: 65  Resp: 16  SpO2: 90%  Weight: 139 lb 3.2 oz (63.1 kg)  Height: 5\' 4"  (1.626 m)  PainSc: 0-No pain   Body mass index is 23.89 kg/m.  Advanced Directives 03/09/2021 03/02/2020  Does Patient Have a Medical Advance Directive? Yes Yes  Type of Paramedic of [place];Living will [place]  Does patient want to make changes to medical advance directive? No - Patient declined -  Copy of [place] in Chart? No - copy requested No - copy requested    Current Medications (verified) Outpatient Encounter Medications as of 03/09/2021  Medication Sig   albuterol (VENTOLIN HFA) 108 (90 Base) MCG/ACT inhaler Inhale 2 puffs into the lungs every 6 (six) hours as needed for wheezing or shortness of breath.   aspirin 81 MG tablet Take 81 mg by mouth daily.   atorvastatin (LIPITOR) 10 MG tablet TAKE 1/2 TABLET BY MOUTH EVERY DAY   Cholecalciferol (VITAMIN D-3 PO) Take 800 Units by mouth daily.   CINNAMON PO Take 1,000 mg by mouth 2 (two) times daily.   fluticasone (FLONASE) 50 MCG/ACT nasal spray SPRAY 2 SPRAYS INTO EACH NOSTRIL EVERY DAY   lisinopril (ZESTRIL) 10 MG tablet TAKE 1 TABLET BY MOUTH EVERY DAY   loratadine (CLARITIN) 10  MG tablet Take 10 mg by mouth daily.   verapamil (CALAN) 120 MG tablet TAKE 1 TABLET BY MOUTH TWICE A DAY   No facility-administered encounter medications on file as of 03/09/2021.    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   Past Surgical History:  Procedure Laterality Date   BREAST EXCISIONAL BIOPSY     BREAST SURGERY     right breast/benign   CATARACT EXTRACTION, BILATERAL     CERVICAL BIOPSY     benign   COLONOSCOPY     DILATION AND CURETTAGE OF UTERUS     due to heavy bleeding   herniated disc  1991   lower back   POLYPECTOMY     Family History  Problem Relation Age of Onset   Heart disease Mother    Stroke Father    Hypertension Sister    Dementia Sister    Colon cancer Neg Hx    Colon polyps Neg  Hx    Esophageal cancer Neg Hx    Rectal cancer Neg Hx    Stomach cancer Neg Hx    Social History   Socioeconomic History   Marital status: Widowed    Spouse name: Not on file   Number of children: 2  Occupational History   Occupation: Retired Regulatory affairs officer  Tobacco Use   Smoking status: Never   Smokeless tobacco: Never  Vaping Use   Vaping Use: Never used  Substance and Sexual Activity   Alcohol use: Never   Drug use: No   Sexual activity: Not on file  Other Topics Concern   Not on file  Social History Narrative   1 daughter lives in [place],   [place], dtr-in-law, grand-dtr live in [place]   Very active around home   Social Determinants of Health   Financial Resource Strain: Not on file  Food Insecurity: No Food Insecurity   Worried About Sales executive in the Last Year: Never true   Arboriculturist in the Last Year: Never true  Transportation Needs: Not on file  Physical Activity: Not on file  Stress: No Stress Concern Present   Feeling of Stress : Not at all  Social Connections: Not on file    Tobacco Counseling Counseling given: No tobacco products used   Clinical Intake:  Pre-visit preparation completed: Yes Pain : No/denies pain Pain Score: 0-No pain   BMI - recorded: 23.89 Nutritional Status: BMI of 19-24  Normal Nutritional Risks: None Diabetes: No How often do you need to have someone help you when you read instructions, pamphlets, or other written materials from your doctor or pharmacy?: 1 - Never Interpreter Needed?: No    Activities of Daily Living In your present state of health, do you have any difficulty performing the following activities: 03/09/2021  Hearing? N  Vision? N  Difficulty concentrating or making decisions? N  Walking or climbing stairs? N  Dressing or bathing? N  Doing errands, shopping? N  Preparing Food and eating ? N  Using the Toilet? N  In the past six months, have you accidently leaked urine? N  Do you have problems with loss of bowel control? N  Managing your Medications? N  Managing your Finances? N  Housekeeping or managing your Housekeeping? N  Some recent data might be hidden    Patient Care Team: [name], MD as PCP - General (Family Medicine) Ishmael Holter, [place] (Optometry) Burnice Logan, [facility] (Inactive) as Pharmacist (Pharmacist)     Assessment:   This is a routine wellness examination for Michelle Whitaker.  Hearing/Vision screen No results found.  Dietary issues and exercise activities discussed: Current Exercise Habits: Home exercise routine, Type of exercise: walking, Time (Minutes): 30, Frequency (Times/Week): 5, Weekly Exercise (Minutes/Week): 150, Intensity: Moderate, Exercise limited by: None identified  Depression Screen PHQ 2/9 Scores 03/09/2021 09/08/2020  03/09/2020 03/02/2020  PHQ - 2 Score 0 0 0 0    Fall Risk Fall Risk  03/09/2021 09/08/2020 03/09/2020 03/02/2020 01/04/2018  Falls in the past year? 0 0 0 0 0  Comment - - - - [place]i Telephone Survey: data to providers prior to load  Number falls in past yr: 0 0 0 0 -  Injury with Fall? 0 0 0 0 -  Risk for fall due to : No Fall Risks - - No Fall Risks -  Follow up Falls evaluation completed;Falls prevention discussed - - Falls evaluation completed;Falls prevention discussed -  FALL RISK PREVENTION PERTAINING TO THE HOME:  Any stairs in or around the home? Yes  If so, are there any without handrails? No  Home free of loose throw rugs in walkways, pet beds, electrical cords, etc? Yes  Adequate lighting in your home to reduce risk of falls? Yes   ASSISTIVE DEVICES UTILIZED TO PREVENT FALLS:  Use of a cane, walker or w/c? No  Grab bars in the bathroom? No  Shower chair or bench in shower? No  Elevated toilet seat or a handicapped toilet? No   Gait steady and fast without use of assistive device  Cognitive Function:     6CIT Screen 03/09/2021  What Year? 0 points  What month? 0 points  What time? 0 points  Count back from 20 0 points  Months in reverse 0 points  Repeat phrase 0 points  Total Score 0    Immunizations Immunization History  Administered Date(s) Administered   Fluad Quad(high Dose 65+) 11/03/2019, 11/16/2020   Moderna Sars-Covid-2 Vaccination 03/28/2019, 04/23/2019, 12/19/2019   Pneumococcal Conjugate-13 01/24/2014   Pneumococcal Polysaccharide-23 12/02/2010   Tdap 02/21/2015    TDAP status: Up to date  Flu Vaccine status: Up to date  Pneumococcal vaccine status: Up to date  Covid-19 vaccine status: Declined, Education has been provided regarding the importance of this vaccine but patient still declined. Advised may receive this vaccine at local pharmacy or Health Dept.or vaccine clinic. Aware to provide a copy of the vaccination record if obtained from  local pharmacy or Health Dept. Verbalized acceptance and understanding.  Qualifies for Shingles Vaccine? Yes   Zostavax completed No   Shingrix Completed?: No.    Education has been provided regarding the importance of this vaccine. Patient has been advised to call insurance company to determine out of pocket expense if they have not yet received this vaccine. Advised may also receive vaccine at local pharmacy or Health Dept. Verbalized acceptance and understanding.  Screening Tests Health Maintenance  Topic Date Due   Hepatitis C Screening  Never done   Zoster Vaccines- Shingrix (1 of 2) Never done   COVID-19 Vaccine (4 - Booster for Moderna series) 03/25/2021 (Originally 02/13/2020)   TETANUS/TDAP  02/20/2025   Pneumonia Vaccine 3+ Years old  Completed   INFLUENZA VACCINE  Completed   DEXA SCAN  Completed   HPV VACCINES  Aged Out   COLONOSCOPY (Pts 45-27yrs Insurance coverage will need to be confirmed)  Discontinued    Health Maintenance  Health Maintenance Due  Topic Date Due   Hepatitis C Screening  Never done   Zoster Vaccines- Shingrix (1 of 2) Never done    Colorectal cancer screening: Type of screening: Colonoscopy. Completed 10/2018. Repeat every 10 years  Bone Density status: Completed 03/2020. Results reflect: Bone density results: OSTEOPOROSIS. Repeat every 2 years.  Lung Cancer Screening: (Low Dose CT Chest recommended if Age 32-80 years, 30 pack-year currently smoking OR have quit w/in 15years.) does not qualify.   Additional Screening:  Vision Screening: Recommended annual ophthalmology exams for early detection of glaucoma and other disorders of the eye. Is the patient up to date with their annual eye exam?  Yes  Who is the provider or what is the name of the office in which the patient attends annual eye exams? [place] Screening: Recommended annual dental exams for proper oral hygiene     Plan:    1- COVID Booster recommended, declined 2-  Shingrix Vaccine recommended - patient will check with  pharmacy to see what cost is 3- Continue active lifestyle - walking and hiking 4- Healthy diet - see recommendations   I have personally reviewed and noted the following in the patients chart:   Medical and social history Use of alcohol, tobacco or illicit drugs  Current medications and supplements including opioid prescriptions.  Functional ability and status Nutritional status Physical activity Advanced directives List of other physicians Hospitalizations, surgeries, and ER visits in previous 12 months Vitals Screenings to include cognitive, depression, and falls Referrals and appointments  In addition, I have reviewed and discussed with patient certain preventive protocols, quality metrics, and best practice recommendations. A written personalized care plan for preventive services as well as general preventive health recommendations were provided to patient.     [name], LPN   2/70/6237

## 2021-03-23 ENCOUNTER — Other Ambulatory Visit: Payer: Self-pay

## 2021-03-23 ENCOUNTER — Ambulatory Visit
Admission: RE | Admit: 2021-03-23 | Discharge: 2021-03-23 | Disposition: A | Discharge status: Home / Self Care | Payer: Medicare Other | Source: Ambulatory Visit | Attending: Family Medicine | Admitting: Family Medicine

## 2021-03-23 DIAGNOSIS — Z1231 Encounter for screening mammogram for malignant neoplasm of breast: Secondary | ICD-10-CM | POA: Diagnosis not present

## 2021-05-19 ENCOUNTER — Telehealth: Payer: Self-pay

## 2021-05-19 NOTE — Progress Notes (Signed)
? ? ?  Chronic Care Management ?Pharmacy Assistant  ? ?Name: Michelle Whitaker  MRN: [id number] DOB: Jan 01, 1946 ? ? ?Reason for Encounter: General Adherence Call  ?  ?Recent office visits:  ?03/09/21 [name] LPN. Seen for Medicare Annual Wellness. No med changes.  ? ?12/15/20 [name] MD. Seen for Routine Visit. No med changes.  ? ?Recent consult visits:  ?None ? ?Hospital visits:  ?None ? ?Medications: ?Outpatient Encounter Medications as of 05/19/2021  ?Medication Sig  ? albuterol (VENTOLIN HFA) 108 (90 Base) MCG/ACT inhaler Inhale 2 puffs into the lungs every 6 (six) hours as needed for wheezing or shortness of breath.  ? aspirin 81 MG tablet Take 81 mg by mouth daily.  ? atorvastatin (LIPITOR) 10 MG tablet TAKE 1/2 TABLET BY MOUTH EVERY DAY  ? Cholecalciferol (VITAMIN D-3 PO) Take 800 Units by mouth daily.  ? CINNAMON PO Take 1,000 mg by mouth 2 (two) times daily.  ? fluticasone (FLONASE) 50 MCG/ACT nasal spray SPRAY 2 SPRAYS INTO EACH NOSTRIL EVERY DAY  ? lisinopril (ZESTRIL) 10 MG tablet TAKE 1 TABLET BY MOUTH EVERY DAY  ? loratadine (CLARITIN) 10 MG tablet Take 10 mg by mouth daily.  ? verapamil (CALAN) 120 MG tablet TAKE 1 TABLET BY MOUTH TWICE A DAY  ? ?No facility-administered encounter medications on file as of 05/19/2021.  ? ? ?Contacted Michelle Whitaker for general disease state and medication adherence call.  ? ?Patient is not > 5 days past due for refill on the following medications per chart history: ? ?[facility] Medications: ?Medication Name/mg Last Fill Days Supply ?Lisinopril '10mg'$    05/09/21 90ds ?    02/15/21 90ds ?Atorvastatin '10mg'$   03/20/21 90ds ?    12/17/20 90ds ?Verapamil '120mg'$    05/18/21 90ds ?    02/21/21  90ds ? ? ?Unable to reach pt to complete this call  ? ?[name], CMA ?Clinical Pharmacist Assistant  ?[phone]  ?

## 2021-06-13 ENCOUNTER — Other Ambulatory Visit: Payer: Medicare Other

## 2021-06-13 ENCOUNTER — Other Ambulatory Visit: Payer: Self-pay

## 2021-06-13 DIAGNOSIS — E782 Mixed hyperlipidemia: Secondary | ICD-10-CM

## 2021-06-13 DIAGNOSIS — I10 Essential (primary) hypertension: Secondary | ICD-10-CM | POA: Diagnosis not present

## 2021-06-13 DIAGNOSIS — R7303 Prediabetes: Secondary | ICD-10-CM

## 2021-06-14 LAB — CBC WITH DIFFERENTIAL/PLATELET
Basophils Absolute: 0 10*3/uL (ref 0.0–0.2)
Basos: 1 %
EOS (ABSOLUTE): 0.1 10*3/uL (ref 0.0–0.4)
Eos: 3 %
Hematocrit: 43.1 % (ref 34.0–46.6)
Hemoglobin: 14.6 g/dL (ref 11.1–15.9)
Immature Grans (Abs): 0 10*3/uL (ref 0.0–0.1)
Immature Granulocytes: 0 %
Lymphocytes Absolute: 1.1 10*3/uL (ref 0.7–3.1)
Lymphs: 32 %
MCH: 29.7 pg (ref 26.6–33.0)
MCHC: 33.9 g/dL (ref 31.5–35.7)
MCV: 88 fL (ref 79–97)
Monocytes Absolute: 0.4 10*3/uL (ref 0.1–0.9)
Monocytes: 11 %
Neutrophils Absolute: 1.8 10*3/uL (ref 1.4–7.0)
Neutrophils: 53 %
Platelets: 209 10*3/uL (ref 150–450)
RBC: 4.91 x10E6/uL (ref 3.77–5.28)
RDW: 12.9 % (ref 11.7–15.4)
WBC: 3.4 10*3/uL (ref 3.4–10.8)

## 2021-06-14 LAB — COMPREHENSIVE METABOLIC PANEL
ALT: 13 IU/L (ref 0–32)
AST: 19 IU/L (ref 0–40)
Albumin/Globulin Ratio: 1.9 (ref 1.2–2.2)
Albumin: 4.5 g/dL (ref 3.7–4.7)
Alkaline Phosphatase: 92 IU/L (ref 44–121)
BUN/Creatinine Ratio: 18 (ref 12–28)
BUN: 13 mg/dL (ref 8–27)
Bilirubin Total: 0.5 mg/dL (ref 0.0–1.2)
CO2: 25 mmol/L (ref 20–29)
Calcium: 9.2 mg/dL (ref 8.7–10.3)
Chloride: 102 mmol/L (ref 96–106)
Creatinine, Ser: 0.74 mg/dL (ref 0.57–1.00)
Globulin, Total: 2.4 g/dL (ref 1.5–4.5)
Glucose: 106 mg/dL — ABNORMAL HIGH (ref 70–99)
Potassium: 4.1 mmol/L (ref 3.5–5.2)
Sodium: 142 mmol/L (ref 134–144)
Total Protein: 6.9 g/dL (ref 6.0–8.5)
eGFR: 84 mL/min/{1.73_m2} (ref >59)

## 2021-06-14 LAB — HEMOGLOBIN A1C
Est. average glucose Bld gHb Est-mCnc: 126 mg/dL
Hgb A1c MFr Bld: 6 % — ABNORMAL HIGH (ref 4.8–5.6)

## 2021-06-14 LAB — LIPID PANEL
Chol/HDL Ratio: 3.6 ratio (ref 0.0–4.4)
Cholesterol, Total: 191 mg/dL (ref 100–199)
HDL: 53 mg/dL (ref >39)
LDL Chol Calc (NIH): 115 mg/dL — ABNORMAL HIGH (ref 0–99)
Triglycerides: 127 mg/dL (ref 0–149)
VLDL Cholesterol Cal: 23 mg/dL (ref 5–40)

## 2021-06-14 LAB — CARDIOVASCULAR RISK ASSESSMENT

## 2021-06-15 ENCOUNTER — Ambulatory Visit: Payer: Medicare Other | Admitting: Family Medicine

## 2021-06-16 ENCOUNTER — Ambulatory Visit (INDEPENDENT_AMBULATORY_CARE_PROVIDER_SITE_OTHER): Payer: Medicare Other | Admitting: Family Medicine

## 2021-06-16 ENCOUNTER — Encounter: Payer: Self-pay | Admitting: Family Medicine

## 2021-06-16 VITALS — BP 130/78 | HR 59 | Temp 97.4°F | Ht 64.0 in | Wt 137.0 lb

## 2021-06-16 DIAGNOSIS — J4520 Mild intermittent asthma, uncomplicated: Secondary | ICD-10-CM

## 2021-06-16 DIAGNOSIS — E782 Mixed hyperlipidemia: Secondary | ICD-10-CM | POA: Diagnosis not present

## 2021-06-16 DIAGNOSIS — M818 Other osteoporosis without current pathological fracture: Secondary | ICD-10-CM

## 2021-06-16 DIAGNOSIS — I10 Essential (primary) hypertension: Secondary | ICD-10-CM | POA: Diagnosis not present

## 2021-06-16 DIAGNOSIS — R7303 Prediabetes: Secondary | ICD-10-CM

## 2021-06-16 MED ORDER — EZETIMIBE 10 MG PO TABS: 10.0000 mg | ORAL_TABLET | Freq: Every day | ORAL | 3 refills | Status: DC

## 2021-06-16 NOTE — Progress Notes (Signed)
? ?Subjective:  ?Patient ID: Michelle Whitaker, female    DOB: 1945-08-21  Age: 76 y.o. MRN: [id number] ? ?Chief Complaint  ?Patient presents with  ? Hypertension  ? Hyperlipidemia  ? ?HPI: ?Hyperlipidemia:  ?Patient is currently taking Atorvastatin 10 mg taking 1/2 tablet daily. ?Eats healthy most of the time. Exercising Walks at [employer]. 2-3 miles a couple of times a week.  ? ?Hypertension: ?Patient is currently taking Lisinopril 10 mg take 1 tablet daily, Verapamil 120 mg take 1 tablet twice daily. ? ?Prediabetes: ?Last A1c: 6.0 ?  ?Asthma, mild, intermittent: continue albuterol HFA 2 puffs four times a day as needed.  ?Increased stress due to dog being sick.  ? ?Current Outpatient Medications on File Prior to Visit  ?Medication Sig Dispense Refill  ? albuterol (VENTOLIN HFA) 108 (90 Base) MCG/ACT inhaler Inhale 2 puffs into the lungs every 6 (six) hours as needed for wheezing or shortness of breath. 1 each 2  ? aspirin 81 MG tablet Take 81 mg by mouth daily.    ? atorvastatin (LIPITOR) 10 MG tablet TAKE 1/2 TABLET BY MOUTH EVERY DAY 45 tablet 3  ? Cholecalciferol (VITAMIN D-3 PO) Take 800 Units by mouth daily.    ? CINNAMON PO Take 1,000 mg by mouth 2 (two) times daily.    ? fluticasone (FLONASE) 50 MCG/ACT nasal spray SPRAY 2 SPRAYS INTO EACH NOSTRIL EVERY DAY 48 mL 1  ? lisinopril (ZESTRIL) 10 MG tablet TAKE 1 TABLET BY MOUTH EVERY DAY 90 tablet 1  ? loratadine (CLARITIN) 10 MG tablet Take 10 mg by mouth daily.    ? verapamil (CALAN) 120 MG tablet TAKE 1 TABLET BY MOUTH TWICE A DAY 180 tablet 3  ? ?No current facility-administered medications on file prior to visit.  ? ?Past Medical History:  ?Diagnosis Date  ? Allergy   ? on claritin and flonase all year   ? Anxiety   ? situational   ? Asthma   ? Cataract 2018  ? removed bilateral  ? Chronic kidney disease   ? kidney stone with lithotripsy  ? History of palpitations   ? Hyperlipidemia   ? Hypertension   ? Osteoporosis   ? 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 ?Past Surgical History:  ?Procedure Laterality Date  ? BREAST EXCISIONAL BIOPSY    ? BREAST SURGERY    ? right breast/benign  ? CATARACT EXTRACTION, BILATERAL    ? CERVICAL BIOPSY    ? benign  ? COLONOSCOPY    ? DILATION AND CURETTAGE OF UTERUS    ? due to heavy bleeding  ? herniated disc  1991  ? lower back  ? POLYPECTOMY    ?  ?Family History  ?Problem Relation Age of Onset  ? Heart disease Mother   ? Stroke Father   ? Hypertension Sister   ? Dementia Sister   ? Colon cancer Neg Hx   ? Colon polyps Neg Hx   ? Esophageal cancer Neg Hx   ? Rectal cancer Neg Hx   ? Stomach cancer Neg Hx   ? Breast cancer Neg Hx   ? ?Social History  ? ?Socioeconomic History  ? Marital status: Widowed  ?  Spouse name: Not on file  ? Number of children: 2  ? Years of education: Not on file  ? Highest education level: Not on file  ?Occupational History  ? Occupation: Retired Regulatory affairs officer  ?Tobacco Use  ? Smoking status: Never  ?  Smokeless tobacco: Never  ?Vaping Use  ? Vaping Use: Never used  ?Substance and Sexual Activity  ? Alcohol use: Never  ? Drug use: No  ? Sexual activity: Not on file  ?Other Topics Concern  ? Not on file  ?Social History Narrative  ? 1 daughter lives in [place],  ? Son, dtr-in-law, grand-dtr live in [place]  ? Very active around home  ? ?Social Determinants of Health  ? ?Financial Resource Strain: Not on file  ?Food Insecurity: No Food Insecurity  ? Worried About Charity fundraiser in the Last Year: Never true  ? Ran Out of Food in the Last Year: Never true  ?Transportation Needs: Not on file  ?Physical Activity: Not on file  ?Stress: Not on file  ?Social Connections: Not on file  ? ? ?Review of Systems  ?Constitutional:  Negative for appetite change, fatigue and fever.  ?HENT:  Negative for congestion, ear pain, sinus pressure and sore throat.   ?Respiratory:  Negative for cough, chest tightness, shortness of breath and wheezing.   ?Cardiovascular:  Negative for chest pain and palpitations.   ?Gastrointestinal:  Negative for abdominal pain, constipation, diarrhea, nausea and vomiting.  ?Genitourinary:  Negative for dysuria and hematuria.  ?Musculoskeletal:  Negative for arthralgias, back pain, joint swelling and myalgias.  ?Skin:  Negative for rash.  ?Neurological:  Negative for dizziness, weakness and headaches.  ?Psychiatric/Behavioral:  Negative for dysphoric mood. The patient is not nervous/anxious.   ? ? ?Objective:  ?BP 130/78 (BP Location: Left Arm, Patient Position: Sitting)   Pulse (!) 59   Temp (!) 97.4 ?F (36.3 ?C) (Temporal)   Ht '5\' 4"'$  (1.626 m)   Wt 137 lb (62.1 kg)   SpO2 97%   BMI 23.52 kg/m?  ? ? ?  06/16/2021  ?  2:19 PM 03/09/2021  ?  9:49 AM 12/15/2020  ?  9:35 AM  ?BP/Weight  ?Systolic BP 932 355 732  ?Diastolic BP 78 74 72  ?Wt. (Lbs) 137 139.2 134  ?BMI 23.52 kg/m2 23.89 kg/m2 23 kg/m2  ? ? ?Physical Exam ?Vitals reviewed.  ?Constitutional:   ?   Appearance: Normal appearance. She is normal weight.  ?Cardiovascular:  ?   Rate and Rhythm: Normal rate and regular rhythm.  ?   Pulses: Normal pulses.  ?   Heart sounds: Normal heart sounds.  ?Pulmonary:  ?   Effort: Pulmonary effort is normal.  ?   Breath sounds: Normal breath sounds.  ?Abdominal:  ?   General: Abdomen is flat. Bowel sounds are normal.  ?   Palpations: Abdomen is soft.  ?Neurological:  ?   Mental Status: She is alert and oriented to person, place, and time.  ?Psychiatric:     ?   Mood and Affect: Mood normal.     ?   Behavior: Behavior normal.  ? ? ?Diabetic Foot Exam - Simple   ?No data filed ?  ?  ? ?Lab Results  ?Component Value Date  ? WBC 3.4 06/13/2021  ? HGB 14.6 06/13/2021  ? HCT 43.1 06/13/2021  ? PLT 209 06/13/2021  ? GLUCOSE 106 (H) 06/13/2021  ? CHOL 191 06/13/2021  ? TRIG 127 06/13/2021  ? HDL 53 06/13/2021  ? LDLCALC 115 (H) 06/13/2021  ? ALT 13 06/13/2021  ? AST 19 06/13/2021  ? NA 142 06/13/2021  ? K 4.1 06/13/2021  ? CL 102 06/13/2021  ? CREATININE 0.74 06/13/2021  ? BUN 13 06/13/2021  ? CO2 25  06/13/2021  ? TSH 2.600 03/09/2020  ? HGBA1C 6.0 (H) 06/13/2021  ? ? ? ? ?Assessment & Plan:  ? ?Problem List Items Addressed This Visit   ? ?  ? Cardiovascular and Mediastinum  ? Essential hypertension, benign  ?  Well controlled.  ?No changes to medicines. Continue Lisinopril 10 mg take 1 tablet daily, Verapamil 120 mg take 1 tablet twice daily. ?Continue to work on eating a healthy diet and exercise.  ?Labs reviewed.  ?  ?  ?  ? Respiratory  ? Asthma  ?  Continue albuterol hfa 2 puffs four times a day as needed.  ? ?  ?  ?  ? Musculoskeletal and Integument  ? Other osteoporosis without current pathological fracture   ?  On holiday from fosamax.  ?Continue calcium with D.  ? ?  ?  ?  ? Other  ? Hyperlipidemia - Primary  ?  Not quite at goal. ?Continue lipitor 10 mg 1/2 daily.  ?Recommend continue to work on eating healthy diet and exercise. ?If does not improve, recommend we will start on zetia.  ? ?  ?  ? Prediabetes  ?  Recommend continue to work on eating healthy diet and exercise. ? ? ?  ?  ?. ? ?Meds ordered this encounter  ?Medications  ? DISCONTD: ezetimibe (ZETIA) 10 MG tablet  ?  Sig: Take 1 tablet (10 mg total) by mouth daily.  ?  Dispense:  90 tablet  ?  Refill:  3  ? ?Follow-up: Return in about 6 months (around 12/16/2021) for chronic follow up. ? ?An After Visit Summary was printed and given to the patient. ? ?I,Lauren M Auman,acting as a scribe for [name], MD.,have documented all relevant documentation on the behalf of [name], MD,as directed by  [name], MD while in the presence of [name], MD.  ? ?[name], MD ?[facility] ?[phone] ?

## 2021-06-19 NOTE — Assessment & Plan Note (Signed)
On holiday from fosamax.  ?Continue calcium with D.  ?

## 2021-06-19 NOTE — Assessment & Plan Note (Signed)
Not quite at goal. ?Continue lipitor 10 mg 1/2 daily.  ?Recommend continue to work on eating healthy diet and exercise. ?If does not improve, recommend we will start on zetia.  ?

## 2021-06-19 NOTE — Assessment & Plan Note (Signed)
Recommend continue to work on eating healthy diet and exercise.  

## 2021-06-19 NOTE — Assessment & Plan Note (Signed)
Continue albuterol hfa 2 puffs four times a day as needed  

## 2021-06-19 NOTE — Assessment & Plan Note (Addendum)
Well controlled.  ?No changes to medicines. Continue Lisinopril 10 mg take 1 tablet daily, Verapamil 120 mg take 1 tablet twice daily. ?Continue to work on eating a healthy diet and exercise.  ?Labs reviewed.  ?

## 2021-07-28 ENCOUNTER — Other Ambulatory Visit: Payer: Self-pay | Admitting: Family Medicine

## 2021-08-01 ENCOUNTER — Other Ambulatory Visit: Payer: Self-pay | Admitting: Family Medicine

## 2021-08-13 ENCOUNTER — Other Ambulatory Visit: Payer: Self-pay | Admitting: Family Medicine

## 2021-08-25 NOTE — Progress Notes (Signed)
Acute Office Visit  Subjective:    Patient ID: Michelle Whitaker, female    DOB: 08-28-1945, 76 y.o.   MRN: [id number]  Chief Complaint  Patient presents with   Cough    HPI: Patient is in today for Upper respiratory symptoms She complains of cough described as nonproductive. Onset was 1 week ago. Treatment has included Robitussin-DM and pushing fluids. Denies fever, chills, night sweats or weight loss. Past history is significant for asthma. Patient is non-smoker. Reports exposure to her daughter-in-law that has similar symptoms. Pt takes Claritin and Flonase daily for chronic allergic rhinitis.   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    Past Surgical History:  Procedure Laterality Date   BREAST EXCISIONAL BIOPSY     BREAST SURGERY     right breast/benign   CATARACT EXTRACTION, BILATERAL     CERVICAL BIOPSY     benign   COLONOSCOPY     DILATION AND CURETTAGE OF UTERUS     due to heavy bleeding   herniated disc  1991   lower back   POLYPECTOMY      Family History  Problem Relation Age of Onset   Heart disease Mother    Stroke Father    Hypertension Sister    Dementia Sister    Colon cancer Neg Hx    Colon polyps Neg Hx    Esophageal cancer Neg Hx    Rectal cancer Neg Hx    Stomach cancer Neg Hx    Breast cancer Neg Hx     Social History   Socioeconomic History   Marital status: Widowed    Spouse name: Not on file   Number of children: 2   Years of education: Not on file   Highest education level: Not on file  Occupational History   Occupation: Retired Regulatory affairs officer  Tobacco Use   Smoking status: Never   Smokeless tobacco: Never  Vaping Use   Vaping Use:  Never used  Substance and Sexual Activity   Alcohol use: Never   Drug use: No   Sexual activity: Yes    Partners: Male  Other Topics Concern   Not on file  Social History Narrative   1 daughter lives in [place],   [place], dtr-in-law, grand-dtr live in [place]   Very active around home   Social Determinants of Health   Financial Resource Strain: Not on file  Food Insecurity: No Food Insecurity (03/09/2021)   Hunger Vital Sign    Worried About Running Out of Food in the Last Year: Never true    Ran Out of Food in the Last Year: Never true  Transportation Needs: Not on file  Physical Activity: Not on file  Stress: No Stress Concern Present (03/02/2020)   [place] of Stress : Not at all  Social Connections: Not on file  Intimate Partner Violence: Not on file    Outpatient Medications Prior to Visit  Medication Sig Dispense Refill   albuterol (VENTOLIN HFA) 108 (90 Base)  MCG/ACT inhaler Inhale 2 puffs into the lungs every 6 (six) hours as needed for wheezing or shortness of breath. 1 each 2   aspirin 81 MG tablet Take 81 mg by mouth daily.     atorvastatin (LIPITOR) 10 MG tablet TAKE 1/2 TABLET BY MOUTH EVERY DAY 45 tablet 3   Cholecalciferol (VITAMIN D-3 PO) Take 800 Units by mouth daily.     CINNAMON PO Take 1,000 mg by mouth 2 (two) times daily.     fluticasone (FLONASE) 50 MCG/ACT nasal spray SPRAY 2 SPRAYS INTO EACH NOSTRIL EVERY DAY 48 mL 1   lisinopril (ZESTRIL) 10 MG tablet TAKE 1 TABLET BY MOUTH EVERY DAY 90 tablet 1   loratadine (CLARITIN) 10 MG tablet Take 10 mg by mouth daily.     verapamil (CALAN) 120 MG tablet TAKE 1 TABLET BY MOUTH TWICE A DAY 180 tablet 1   No facility-administered medications prior to visit.    Allergies  Allergen Reactions   Atenolol    Codeine    Erythromycin     REACTION: Nausea   Morphine     REACTION: nausea/anxiety   Penicillins    Statins     Causes  numbness side face    Review of Systems  Constitutional:  Positive for fatigue. Negative for chills and fever.  HENT:  Positive for congestion and voice change. Negative for ear pain and sore throat.   Respiratory:  Positive for cough. Negative for shortness of breath.   Cardiovascular:  Negative for chest pain and palpitations.  Gastrointestinal:  Negative for abdominal pain, constipation, diarrhea, nausea and vomiting.  Endocrine: Negative for polydipsia, polyphagia and polyuria.  Genitourinary:  Negative for difficulty urinating and dysuria.  Musculoskeletal:  Negative for arthralgias, back pain and myalgias.  Skin:  Negative for rash.  Allergic/Immunologic: Positive for environmental allergies.  Neurological:  Negative for headaches.  Psychiatric/Behavioral:  Negative for dysphoric mood. The patient is not nervous/anxious.        Objective:    Physical Exam Vitals reviewed.  HENT:     Head: Normocephalic.     Right Ear: Tympanic membrane normal.     Left Ear: Tympanic membrane normal.     Nose: No congestion or rhinorrhea.     Mouth/Throat:     Pharynx: Posterior oropharyngeal erythema present.  Eyes:     Pupils: Pupils are equal, round, and reactive to light.  Cardiovascular:     Rate and Rhythm: Normal rate and regular rhythm.     Pulses: Normal pulses.     Heart sounds: Normal heart sounds.  Pulmonary:     Effort: Pulmonary effort is normal.     Breath sounds: Normal breath sounds.  Skin:    General: Skin is warm and dry.     Capillary Refill: Capillary refill takes less than 2 seconds.  Neurological:     Mental Status: She is alert.     BP 120/60   Pulse 75   Temp 98.9 F (37.2 C)   Resp 15   Ht '5\' 4"'  (1.626 m)   Wt 134 lb (60.8 kg)   SpO2 96%   BMI 23.00 kg/m  Wt Readings from Last 3 Encounters:  08/26/21 134 lb (60.8 kg)  06/16/21 137 lb (62.1 kg)  03/09/21 139 lb 3.2 oz (63.1 kg)    Health Maintenance Due  Topic Date Due   Hepatitis C  Screening  Never done   Zoster Vaccines- Shingrix (1 of 2) Never done   COVID-19  Vaccine (4 - Moderna series) 02/13/2020       Lab Results  Component Value Date   TSH 2.600 03/09/2020   Lab Results  Component Value Date   WBC 3.4 06/13/2021   HGB 14.6 06/13/2021   HCT 43.1 06/13/2021   MCV 88 06/13/2021   PLT 209 06/13/2021   Lab Results  Component Value Date   NA 142 06/13/2021   K 4.1 06/13/2021   CO2 25 06/13/2021   GLUCOSE 106 (H) 06/13/2021   BUN 13 06/13/2021   CREATININE 0.74 06/13/2021   BILITOT 0.5 06/13/2021   ALKPHOS 92 06/13/2021   AST 19 06/13/2021   ALT 13 06/13/2021   PROT 6.9 06/13/2021   ALBUMIN 4.5 06/13/2021   CALCIUM 9.2 06/13/2021   EGFR 84 06/13/2021   Lab Results  Component Value Date   CHOL 191 06/13/2021   Lab Results  Component Value Date   HDL 53 06/13/2021   Lab Results  Component Value Date   LDLCALC 115 (H) 06/13/2021   Lab Results  Component Value Date   TRIG 127 06/13/2021   Lab Results  Component Value Date   CHOLHDL 3.6 06/13/2021   Lab Results  Component Value Date   HGBA1C 6.0 (H) 06/13/2021       Assessment & Plan:   1. Viral upper respiratory tract infection -rest and push fluids -Take Mucinex-DM as directed  2. Acute cough -take Delsym OTC or Robitussin-DM     Rest and push fluids Take Mucinex-DM as directed Take Delsym cough syrup or Robitussin DM as directed Follow-up as needed   Follow-up: PRN  An After Visit Summary was printed and given to the patient.  I, Rip Harbour, NP, have reviewed all documentation for this visit. The documentation on 08/26/21 for the exam, diagnosis, procedures, and orders are all accurate and complete.   Signed, Rip Harbour, NP [facility] [phone]

## 2021-08-26 ENCOUNTER — Encounter: Payer: Self-pay | Admitting: Nurse Practitioner

## 2021-08-26 ENCOUNTER — Ambulatory Visit (INDEPENDENT_AMBULATORY_CARE_PROVIDER_SITE_OTHER): Payer: Medicare Other | Admitting: Nurse Practitioner

## 2021-08-26 VITALS — BP 120/60 | HR 75 | Temp 98.9°F | Resp 15 | Ht 64.0 in | Wt 134.0 lb

## 2021-08-26 DIAGNOSIS — R051 Acute cough: Secondary | ICD-10-CM

## 2021-08-26 DIAGNOSIS — J069 Acute upper respiratory infection, unspecified: Secondary | ICD-10-CM | POA: Diagnosis not present

## 2021-08-26 NOTE — Patient Instructions (Signed)
Rest and push fluids Take Mucinex-DM as directed Take Delsym cough syrup or Robitussin DM as directed Follow-up as needed     Upper Respiratory Infection, Adult An upper respiratory infection (URI) affects the nose, throat, and upper airways that lead to the lungs. The most common type of URI is often called the common cold. URIs usually get better on their own, without medical treatment. What are the causes? A URI is caused by a germ (virus). You may catch these germs by: Breathing in droplets from an infected person's cough or sneeze. Touching something that has the germ on it (is contaminated) and then touching your mouth, nose, or eyes. What increases the risk? You are more likely to get a URI if: You are very young or very old. You have close contact with others, such as at work, school, or a health care facility. You smoke. You have long-term (chronic) heart or lung disease. You have a weakened disease-fighting system (immune system). You have nasal allergies or asthma. You have a lot of stress. You have poor nutrition. What are the signs or symptoms? Runny or stuffy (congested) nose. Cough. Sneezing. Sore throat. Headache. Feeling tired (fatigue). Fever. Not wanting to eat as much as usual. Pain in your forehead, behind your eyes, and over your cheekbones (sinus pain). Muscle aches. Redness or irritation of the eyes. Pressure in the ears or face. How is this treated? URIs usually get better on their own within 7-10 days. Medicines cannot cure URIs, but your doctor may recommend certain medicines to help relieve symptoms, such as: Over-the-counter cold medicines. Medicines to reduce coughing (cough suppressants). Coughing is a type of defense against infection that helps to clear the nose, throat, windpipe, and lungs (respiratory system). Take these medicines only as told by your doctor. Medicines to lower your fever. Follow these instructions at home: Activity Rest  as needed. If you have a fever, stay home from work or school until your fever is gone, or until your doctor says you may return to work or school. You should stay home until you cannot spread the infection anymore (you are not contagious). Your doctor may have you wear a face mask so you have less risk of spreading the infection. Relieving symptoms Rinse your mouth often with salt water. To make salt water, dissolve -1 tsp (3-6 g) of salt in 1 cup (237 mL) of warm water. Use a cool-mist humidifier to add moisture to the air. This can help you breathe more easily. Eating and drinking  Drink enough fluid to keep your pee (urine) pale yellow. Eat soups and other clear broths. General instructions  Take over-the-counter and prescription medicines only as told by your doctor. Do not smoke or use any products that contain nicotine or tobacco. If you need help quitting, ask your doctor. Avoid being where people are smoking (avoid secondhand smoke). Stay up to date on all your shots (immunizations), and get the flu shot every year. Keep all follow-up visits. How to prevent the spread of infection to others  Wash your hands with soap and water for at least 20 seconds. If you cannot use soap and water, use hand sanitizer. Avoid touching your mouth, face, eyes, or nose. Cough or sneeze into a tissue or your sleeve or elbow. Do not cough or sneeze into your hand or into the air. Contact a doctor if: You are getting worse, not better. You have any of these: A fever or chills. Brown or red mucus in your nose.  Yellow or brown fluid (discharge)coming from your nose. Pain in your face, especially when you bend forward. Swollen neck glands. Pain when you swallow. White areas in the back of your throat. Get help right away if: You have shortness of breath that gets worse. You have very bad or constant: Headache. Ear pain. Pain in your forehead, behind your eyes, and over your cheekbones (sinus  pain). Chest pain. You have long-lasting (chronic) lung disease along with any of these: Making high-pitched whistling sounds when you breathe, most often when you breathe out (wheezing). Long-lasting cough (more than 14 days). Coughing up blood. A change in your usual mucus. You have a stiff neck. You have changes in your: Vision. Hearing. Thinking. Mood. These symptoms may be an emergency. Get help right away. Call 911. Do not wait to see if the symptoms will go away. Do not drive yourself to the hospital. Summary An upper respiratory infection (URI) is caused by a germ (virus). The most common type of URI is often called the common cold. URIs usually get better within 7-10 days. Take over-the-counter and prescription medicines only as told by your doctor. This information is not intended to replace advice given to you by your health care provider. Make sure you discuss any questions you have with your health care provider. Document Revised: 09/08/2020 Document Reviewed: 09/08/2020 Elsevier Patient Education  [place].

## 2021-09-01 ENCOUNTER — Telehealth: Payer: Self-pay

## 2021-09-01 ENCOUNTER — Other Ambulatory Visit: Payer: Self-pay | Admitting: Nurse Practitioner

## 2021-09-01 DIAGNOSIS — J00 Acute nasopharyngitis [common cold]: Secondary | ICD-10-CM

## 2021-09-01 MED ORDER — AZITHROMYCIN 250 MG PO TABS: ORAL_TABLET | ORAL | 0 refills | Status: AC

## 2021-09-01 MED ORDER — ALBUTEROL SULFATE HFA 108 (90 BASE) MCG/ACT IN AERS: 2.0000 | INHALATION_SPRAY | Freq: Four times a day (QID) | RESPIRATORY_TRACT | 2 refills | Status: DC | PRN

## 2021-09-01 NOTE — Telephone Encounter (Signed)
Patient called stating that her cough is not going away, she knows that you both had discussed possible antibiotics and if she was not better to call you back and let you know which one she would be willing to take, and she stated that she is willing to take the Zpack cause it doesn't give her as many side effects she stated. She states she still has not ran a fever and that her continuous cough/dry is what is aggravating her the most. She states that she has no drainage at all. She is also requesting refill on her Proventil inhailer as well. Please advise.

## 2021-09-21 ENCOUNTER — Other Ambulatory Visit: Payer: Self-pay | Admitting: Physician Assistant

## 2021-09-26 DIAGNOSIS — H26493 Other secondary cataract, bilateral: Secondary | ICD-10-CM | POA: Diagnosis not present

## 2021-09-30 ENCOUNTER — Encounter: Payer: Self-pay | Admitting: Family Medicine

## 2021-09-30 ENCOUNTER — Ambulatory Visit (INDEPENDENT_AMBULATORY_CARE_PROVIDER_SITE_OTHER): Payer: Medicare Other | Admitting: Family Medicine

## 2021-09-30 VITALS — BP 136/66 | HR 80 | Temp 97.3°F | Resp 16 | Ht 64.0 in | Wt 133.2 lb

## 2021-09-30 DIAGNOSIS — E507 Other ocular manifestations of vitamin A deficiency: Secondary | ICD-10-CM

## 2021-09-30 DIAGNOSIS — I10 Essential (primary) hypertension: Secondary | ICD-10-CM

## 2021-09-30 DIAGNOSIS — E782 Mixed hyperlipidemia: Secondary | ICD-10-CM | POA: Diagnosis not present

## 2021-09-30 DIAGNOSIS — R7303 Prediabetes: Secondary | ICD-10-CM

## 2021-09-30 DIAGNOSIS — K117 Disturbances of salivary secretion: Secondary | ICD-10-CM | POA: Diagnosis not present

## 2021-09-30 NOTE — Progress Notes (Signed)
Acute Office Visit  Subjective:    Patient ID: Michelle Whitaker, female    DOB: 1945/11/17, 76 y.o.   MRN: [id number]  Chief Complaint  Patient presents with   dry mouth   Cough    HPI: Patient is in today with excessive dry mouth which started about 2 weeks ago.  She is recovering from an upper respiratory infection from about 1 month ago.  She continues to have cough which is worse in the early morning hours.  No fever or chills reported. Sinuses uncomfortable. No rhinorrhea, headaches. Given a zpack which helped cough, but developed a severe dry mouth. Held flovent and antihistamine earlier this week.   Checked sbp yesterday and it was 147. Glucose 114.   Current Outpatient Medications on File Prior to Visit  Medication Sig Dispense Refill   albuterol (VENTOLIN HFA) 108 (90 Base) MCG/ACT inhaler Inhale 2 puffs into the lungs every 6 (six) hours as needed for wheezing or shortness of breath. 8 g 2   aspirin 81 MG tablet Take 81 mg by mouth daily.     atorvastatin (LIPITOR) 10 MG tablet TAKE 1/2 TABLET BY MOUTH EVERY DAY 45 tablet 3   Cholecalciferol (VITAMIN D-3 PO) Take 800 Units by mouth daily.     CINNAMON PO Take 1,000 mg by mouth 2 (two) times daily.     fluticasone (FLONASE) 50 MCG/ACT nasal spray SPRAY 2 SPRAYS INTO EACH NOSTRIL EVERY DAY 48 mL 1   lisinopril (ZESTRIL) 10 MG tablet TAKE 1 TABLET BY MOUTH EVERY DAY 90 tablet 1   loratadine (CLARITIN) 10 MG tablet Take 10 mg by mouth daily.     verapamil (CALAN) 120 MG tablet TAKE 1 TABLET BY MOUTH TWICE A DAY 180 tablet 1   No current facility-administered medications on file prior to visit.    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    Past Surgical History:  Procedure Laterality Date   BREAST EXCISIONAL BIOPSY     BREAST SURGERY     right breast/benign   CATARACT EXTRACTION, BILATERAL     CERVICAL BIOPSY     benign   COLONOSCOPY     DILATION AND CURETTAGE OF UTERUS     due to heavy bleeding   herniated disc  1991   lower back   POLYPECTOMY      Family History  Problem Relation Age of Onset   Heart disease Mother    Stroke Father    Hypertension Sister    Dementia Sister    Colon cancer Neg Hx    Colon polyps Neg Hx    Esophageal cancer Neg Hx    Rectal cancer Neg Hx    Stomach cancer Neg Hx    Breast cancer Neg Hx     Social History   Socioeconomic History   Marital status: Widowed    Spouse name: Not on file   Number of children: 2   Years of education: Not on file   Highest education level: Not on file  Occupational History   Occupation: Retired  Seamstress  Tobacco Use   Smoking status: Never   Smokeless tobacco: Never  Vaping Use   Vaping Use: Never used  Substance and Sexual Activity   Alcohol use: Never   Drug use: No   Sexual activity: Yes    Partners: Male  Other Topics Concern   Not on file  Social History Narrative   1 daughter lives in [place],   [place], dtr-in-law, grand-dtr live in [place]   Very active around home   Social Determinants of Health   Financial Resource Strain: Not on file  Food Insecurity: No Food Insecurity (03/09/2021)   Hunger Vital Sign    Worried About Running Out of Food in the Last Year: Never true    Ran Out of Food in the Last Year: Never true  Transportation Needs: Not on file  Physical Activity: Not on file  Stress: No Stress Concern Present (03/02/2020)   [place] of Stress : Not at all  Social Connections: Not on file  Intimate Partner Violence: Not on file    Outpatient Medications Prior to Visit  Medication Sig Dispense Refill   albuterol  (VENTOLIN HFA) 108 (90 Base) MCG/ACT inhaler Inhale 2 puffs into the lungs every 6 (six) hours as needed for wheezing or shortness of breath. 8 g 2   aspirin 81 MG tablet Take 81 mg by mouth daily.     atorvastatin (LIPITOR) 10 MG tablet TAKE 1/2 TABLET BY MOUTH EVERY DAY 45 tablet 3   Cholecalciferol (VITAMIN D-3 PO) Take 800 Units by mouth daily.     CINNAMON PO Take 1,000 mg by mouth 2 (two) times daily.     fluticasone (FLONASE) 50 MCG/ACT nasal spray SPRAY 2 SPRAYS INTO EACH NOSTRIL EVERY DAY 48 mL 1   lisinopril (ZESTRIL) 10 MG tablet TAKE 1 TABLET BY MOUTH EVERY DAY 90 tablet 1   loratadine (CLARITIN) 10 MG tablet Take 10 mg by mouth daily.     verapamil (CALAN) 120 MG tablet TAKE 1 TABLET BY MOUTH TWICE A DAY 180 tablet 1   albuterol (VENTOLIN HFA) 108 (90 Base) MCG/ACT inhaler Inhale 2 puffs into the lungs every 6 (six) hours as needed for wheezing or shortness of breath. 1 each 2   No facility-administered medications prior to visit.    Allergies  Allergen Reactions   Atenolol    Codeine    Erythromycin     REACTION: Nausea   Morphine     REACTION: nausea/anxiety   Penicillins    Statins     Causes numbness side face    Review of Systems  Constitutional:  Positive for fatigue. Negative for chills and fever.  HENT:  Positive for congestion.   Respiratory:  Positive for cough.   Cardiovascular:  Negative for chest pain.  Gastrointestinal:  Positive for nausea.  Endocrine: Negative for polydipsia and polyphagia.  Genitourinary:  Negative for dysuria.  Neurological:  Positive for weakness. Negative for dizziness and headaches.       Balance issues   Psychiatric/Behavioral:  Negative for dysphoric mood. The patient is not nervous/anxious.        Objective:    Physical Exam Vitals reviewed.  Constitutional:      Appearance: Normal appearance.  HENT:     Right Ear: Tympanic membrane, ear canal and external ear normal.     Left Ear: Tympanic membrane, ear canal and  external ear normal.  Nose: Congestion (erythema) present.     Mouth/Throat:     Pharynx: Oropharynx is clear.  Cardiovascular:     Rate and Rhythm: Normal rate and regular rhythm.     Heart sounds: Normal heart sounds. No murmur heard. Pulmonary:     Effort: Pulmonary effort is normal. No respiratory distress.     Breath sounds: Normal breath sounds.  Lymphadenopathy:     Cervical: No cervical adenopathy.  Neurological:     Mental Status: She is alert and oriented to person, place, and time.  Psychiatric:        Mood and Affect: Mood normal.        Behavior: Behavior normal.     BP 136/66   Pulse 80   Temp (!) 97.3 F (36.3 C)   Resp 16   Ht _0  (1.626 m)   Wt 133 lb 3.2 oz (60.4 kg)   BMI 22.86 kg/m  Wt Readings from Last 3 Encounters:  09/30/21 133 lb 3.2 oz (60.4 kg)  08/26/21 134 lb (60.8 kg)  06/16/21 137 lb (62.1 kg)    Health Maintenance Due  Topic Date Due   Hepatitis C Screening  Never done   Zoster Vaccines- Shingrix (1 of 2) Never done   COVID-19 Vaccine (4 - Moderna series) 02/13/2020   INFLUENZA VACCINE  09/20/2021    There are no preventive care reminders to display for this patient.   Lab Results  Component Value Date   TSH 1.400 09/30/2021   Lab Results  Component Value Date   WBC 5.5 09/30/2021   HGB 14.5 09/30/2021   HCT 42.5 09/30/2021   MCV 86 09/30/2021   PLT 221 09/30/2021   Lab Results  Component Value Date   NA 137 09/30/2021   K 3.7 09/30/2021   CO2 22 09/30/2021   GLUCOSE 138 (H) 09/30/2021   BUN 14 09/30/2021   CREATININE 0.84 09/30/2021   BILITOT 0.7 09/30/2021   ALKPHOS 88 09/30/2021   AST 17 09/30/2021   ALT 14 09/30/2021   PROT 6.8 09/30/2021   ALBUMIN 4.6 09/30/2021   CALCIUM 9.6 09/30/2021   EGFR 72 09/30/2021   Lab Results  Component Value Date   CHOL 191 06/13/2021   Lab Results  Component Value Date   HDL 53 06/13/2021   Lab Results  Component Value Date   LDLCALC 115 (H) 06/13/2021   Lab  Results  Component Value Date   TRIG 127 06/13/2021   Lab Results  Component Value Date   CHOLHDL 3.6 06/13/2021   Lab Results  Component Value Date   HGBA1C 6.0 (H) 06/13/2021       Assessment & Plan:   Problem List Items Addressed This Visit       Cardiovascular and Mediastinum   Essential hypertension, benign    Well controlled.  No changes to medicines.  Continue to work on eating a healthy diet and exercise.  Labs drawn today.        Relevant Orders   TSH (Completed)     Digestive   Xerostomia - Primary    Check labs.      Relevant Orders   CBC with Differential/Platelet (Completed)   Comprehensive metabolic panel (Completed)   TSH (Completed)   Sjogren's syndrome antibods(ssa + ssb) (Completed)     Other   Hyperlipidemia    Well controlled.  No changes to medicines. Continue atorvastatin 10 mg before bed.  Continue to work on eating a healthy diet and exercise.  Relevant Orders   TSH (Completed)   Prediabetes    Recommend continue to work on eating healthy diet and exercise.       Relevant Orders   TSH (Completed)   Xerophthalmia    Check labs.       Relevant Orders   CBC with Differential/Platelet (Completed)   Comprehensive metabolic panel (Completed)   TSH (Completed)   Sjogren's syndrome antibods(ssa + ssb) (Completed)   No orders of the defined types were placed in this encounter.   Orders Placed This Encounter  Procedures   CBC with Differential/Platelet   Comprehensive metabolic panel   TSH   Sjogren's syndrome antibods(ssa + ssb)     Follow-up: No follow-ups on file.  An After Visit Summary was printed and given to the patient.  [name], MD [facility] [phone]

## 2021-10-01 LAB — COMPREHENSIVE METABOLIC PANEL
ALT: 14 IU/L (ref 0–32)
AST: 17 IU/L (ref 0–40)
Albumin/Globulin Ratio: 2.1 (ref 1.2–2.2)
Albumin: 4.6 g/dL (ref 3.8–4.8)
Alkaline Phosphatase: 88 IU/L (ref 44–121)
BUN/Creatinine Ratio: 17 (ref 12–28)
BUN: 14 mg/dL (ref 8–27)
Bilirubin Total: 0.7 mg/dL (ref 0.0–1.2)
CO2: 22 mmol/L (ref 20–29)
Calcium: 9.6 mg/dL (ref 8.7–10.3)
Chloride: 99 mmol/L (ref 96–106)
Creatinine, Ser: 0.84 mg/dL (ref 0.57–1.00)
Globulin, Total: 2.2 g/dL (ref 1.5–4.5)
Glucose: 138 mg/dL — ABNORMAL HIGH (ref 70–99)
Potassium: 3.7 mmol/L (ref 3.5–5.2)
Sodium: 137 mmol/L (ref 134–144)
Total Protein: 6.8 g/dL (ref 6.0–8.5)
eGFR: 72 mL/min/{1.73_m2} (ref >59)

## 2021-10-01 LAB — CBC WITH DIFFERENTIAL/PLATELET
Basophils Absolute: 0 10*3/uL (ref 0.0–0.2)
Basos: 1 %
EOS (ABSOLUTE): 0.1 10*3/uL (ref 0.0–0.4)
Eos: 2 %
Hematocrit: 42.5 % (ref 34.0–46.6)
Hemoglobin: 14.5 g/dL (ref 11.1–15.9)
Immature Grans (Abs): 0 10*3/uL (ref 0.0–0.1)
Immature Granulocytes: 0 %
Lymphocytes Absolute: 1.2 10*3/uL (ref 0.7–3.1)
Lymphs: 22 %
MCH: 29.2 pg (ref 26.6–33.0)
MCHC: 34.1 g/dL (ref 31.5–35.7)
MCV: 86 fL (ref 79–97)
Monocytes Absolute: 0.4 10*3/uL (ref 0.1–0.9)
Monocytes: 7 %
Neutrophils Absolute: 3.8 10*3/uL (ref 1.4–7.0)
Neutrophils: 68 %
Platelets: 221 10*3/uL (ref 150–450)
RBC: 4.96 x10E6/uL (ref 3.77–5.28)
RDW: 12.1 % (ref 11.7–15.4)
WBC: 5.5 10*3/uL (ref 3.4–10.8)

## 2021-10-01 LAB — SJOGREN'S SYNDROME ANTIBODS(SSA + SSB)
ENA SSA (RO) Ab: <0.2 AI (ref 0.0–0.9)
ENA SSB (LA) Ab: <0.2 AI (ref 0.0–0.9)

## 2021-10-01 LAB — TSH: TSH: 1.4 u[IU]/mL (ref 0.450–4.500)

## 2021-10-09 NOTE — Assessment & Plan Note (Signed)
Well controlled.  No changes to medicines. Continue atorvastatin 10 mg before bed.  Continue to work on eating a healthy diet and exercise.

## 2021-10-09 NOTE — Assessment & Plan Note (Signed)
Recommend continue to work on eating healthy diet and exercise.  

## 2021-10-09 NOTE — Assessment & Plan Note (Signed)
Well controlled.  ?No changes to medicines.  ?Continue to work on eating a healthy diet and exercise.  ?Labs drawn today.  ?

## 2021-10-09 NOTE — Assessment & Plan Note (Signed)
Check labs 

## 2021-11-11 ENCOUNTER — Telehealth: Payer: Self-pay

## 2021-11-11 ENCOUNTER — Other Ambulatory Visit: Payer: Self-pay | Admitting: Nurse Practitioner

## 2021-11-11 DIAGNOSIS — R300 Dysuria: Secondary | ICD-10-CM

## 2021-11-11 MED ORDER — NITROFURANTOIN MONOHYD MACRO 100 MG PO CAPS: 100.0000 mg | ORAL_CAPSULE | Freq: Two times a day (BID) | ORAL | 0 refills | Status: DC

## 2021-11-11 NOTE — Telephone Encounter (Signed)
Patient was diagnosed with Covid 2 days ago. She is in quarantine. She has frequency urination. She asked if any prescription can be sent to the pharmacy for cystitis. Jerrell [facility] sent Macrobid to pharmacy. Patient was informed.

## 2021-12-19 ENCOUNTER — Other Ambulatory Visit: Payer: Self-pay

## 2021-12-19 ENCOUNTER — Other Ambulatory Visit: Payer: Medicare Other

## 2021-12-19 DIAGNOSIS — E782 Mixed hyperlipidemia: Secondary | ICD-10-CM | POA: Diagnosis not present

## 2021-12-19 DIAGNOSIS — I10 Essential (primary) hypertension: Secondary | ICD-10-CM

## 2021-12-19 DIAGNOSIS — R7303 Prediabetes: Secondary | ICD-10-CM | POA: Diagnosis not present

## 2021-12-20 LAB — CBC WITH DIFFERENTIAL/PLATELET
Basophils Absolute: 0 10*3/uL (ref 0.0–0.2)
Basos: 1 %
EOS (ABSOLUTE): 0.1 10*3/uL (ref 0.0–0.4)
Eos: 2 %
Hematocrit: 43.4 % (ref 34.0–46.6)
Hemoglobin: 14.4 g/dL (ref 11.1–15.9)
Immature Grans (Abs): 0 10*3/uL (ref 0.0–0.1)
Immature Granulocytes: 0 %
Lymphocytes Absolute: 1.4 10*3/uL (ref 0.7–3.1)
Lymphs: 34 %
MCH: 29.7 pg (ref 26.6–33.0)
MCHC: 33.2 g/dL (ref 31.5–35.7)
MCV: 90 fL (ref 79–97)
Monocytes Absolute: 0.4 10*3/uL (ref 0.1–0.9)
Monocytes: 10 %
Neutrophils Absolute: 2.2 10*3/uL (ref 1.4–7.0)
Neutrophils: 53 %
Platelets: 199 10*3/uL (ref 150–450)
RBC: 4.85 x10E6/uL (ref 3.77–5.28)
RDW: 12.8 % (ref 11.7–15.4)
WBC: 4.1 10*3/uL (ref 3.4–10.8)

## 2021-12-20 LAB — LIPID PANEL W/O CHOL/HDL RATIO
Cholesterol, Total: 168 mg/dL (ref 100–199)
HDL: 55 mg/dL (ref >39)
LDL Chol Calc (NIH): 89 mg/dL (ref 0–99)
Triglycerides: 137 mg/dL (ref 0–149)
VLDL Cholesterol Cal: 24 mg/dL (ref 5–40)

## 2021-12-20 LAB — COMPREHENSIVE METABOLIC PANEL
ALT: 11 IU/L (ref 0–32)
AST: 19 IU/L (ref 0–40)
Albumin/Globulin Ratio: 2 (ref 1.2–2.2)
Albumin: 4.4 g/dL (ref 3.8–4.8)
Alkaline Phosphatase: 82 IU/L (ref 44–121)
BUN/Creatinine Ratio: 18 (ref 12–28)
BUN: 13 mg/dL (ref 8–27)
Bilirubin Total: 0.7 mg/dL (ref 0.0–1.2)
CO2: 23 mmol/L (ref 20–29)
Calcium: 9.2 mg/dL (ref 8.7–10.3)
Chloride: 102 mmol/L (ref 96–106)
Creatinine, Ser: 0.73 mg/dL (ref 0.57–1.00)
Globulin, Total: 2.2 g/dL (ref 1.5–4.5)
Glucose: 108 mg/dL — ABNORMAL HIGH (ref 70–99)
Potassium: 4 mmol/L (ref 3.5–5.2)
Sodium: 140 mmol/L (ref 134–144)
Total Protein: 6.6 g/dL (ref 6.0–8.5)
eGFR: 85 mL/min/{1.73_m2} (ref >59)

## 2021-12-20 LAB — CARDIOVASCULAR RISK ASSESSMENT

## 2021-12-20 LAB — HGB A1C W/O EAG: Hgb A1c MFr Bld: 6.2 % — ABNORMAL HIGH (ref 4.8–5.6)

## 2021-12-22 ENCOUNTER — Encounter: Payer: Self-pay | Admitting: Family Medicine

## 2021-12-22 ENCOUNTER — Ambulatory Visit (INDEPENDENT_AMBULATORY_CARE_PROVIDER_SITE_OTHER): Payer: Medicare Other | Admitting: Family Medicine

## 2021-12-22 VITALS — BP 110/60 | HR 65 | Temp 97.8°F | Ht 64.0 in | Wt 134.0 lb

## 2021-12-22 DIAGNOSIS — I10 Essential (primary) hypertension: Secondary | ICD-10-CM | POA: Diagnosis not present

## 2021-12-22 DIAGNOSIS — E782 Mixed hyperlipidemia: Secondary | ICD-10-CM

## 2021-12-22 DIAGNOSIS — R197 Diarrhea, unspecified: Secondary | ICD-10-CM | POA: Insufficient documentation

## 2021-12-22 DIAGNOSIS — R6882 Decreased libido: Secondary | ICD-10-CM

## 2021-12-22 DIAGNOSIS — R7303 Prediabetes: Secondary | ICD-10-CM | POA: Diagnosis not present

## 2021-12-22 DIAGNOSIS — Z23 Encounter for immunization: Secondary | ICD-10-CM | POA: Diagnosis not present

## 2021-12-22 NOTE — Progress Notes (Signed)
Subjective:  Patient ID: Michelle Whitaker, female    DOB: Jan 14, 1946  Age: 76 y.o. MRN: [id number]  Chief Complaint  Patient presents with   Hypertension   Hyperlipidemia   Prediabetes    HPI Hyperlipidemia:  Patient is currently taking Atorvastatin 10 mg taking 1/2 tablet daily. Eats healthy most of the time. Exercising Walks at [employer]. 2-3 miles a couple of times a week.   Hypertension: Patient is currently taking Lisinopril 10 mg take 1 tablet daily, Verapamil 120 mg take 1 tablet twice daily.  Prediabetes: Last A1c: 6.2%   Asthma, mild, intermittent: continue albuterol HFA 2 puffs four times a day as needed. Rarely needs albuterol hfa.   Allergies: restarted claritin and using flonase. [place]eping.   Current Outpatient Medications on File Prior to Visit  Medication Sig Dispense Refill   albuterol (VENTOLIN HFA) 108 (90 Base) MCG/ACT inhaler Inhale 2 puffs into the lungs every 6 (six) hours as needed for wheezing or shortness of breath. 8 g 2   aspirin 81 MG tablet Take 81 mg by mouth daily.     atorvastatin (LIPITOR) 10 MG tablet TAKE 1/2 TABLET BY MOUTH EVERY DAY 45 tablet 3   Cholecalciferol (VITAMIN D-3 PO) Take 800 Units by mouth daily.     CINNAMON PO Take 1,000 mg by mouth 2 (two) times daily.     fluticasone (FLONASE) 50 MCG/ACT nasal spray SPRAY 2 SPRAYS INTO EACH NOSTRIL EVERY DAY 48 mL 1   lisinopril (ZESTRIL) 10 MG tablet TAKE 1 TABLET BY MOUTH EVERY DAY 90 tablet 1   loratadine (CLARITIN) 10 MG tablet Take 10 mg by mouth daily.     verapamil (CALAN) 120 MG tablet TAKE 1 TABLET BY MOUTH TWICE A DAY 180 tablet 1   No current facility-administered medications on file prior to visit.   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   Past Surgical History:  Procedure Laterality Date   BREAST EXCISIONAL BIOPSY     BREAST SURGERY     right breast/benign   CATARACT EXTRACTION, BILATERAL     CERVICAL BIOPSY     benign   COLONOSCOPY     DILATION AND CURETTAGE OF UTERUS     due to heavy bleeding   herniated disc  1991   lower back   POLYPECTOMY      Family History  Problem Relation Age of Onset   Heart disease Mother    Stroke Father    Hypertension Sister    Dementia Sister    Colon cancer Neg Hx    Colon polyps Neg Hx    Esophageal cancer Neg Hx    Rectal cancer Neg Hx    Stomach cancer Neg Hx    Breast cancer Neg Hx    Social History   Socioeconomic History   Marital status: Widowed    Spouse name: Not on file   Number of children: 2   Years of education: Not on file   Highest education level: Not on file  Occupational History   Occupation: Retired Regulatory affairs officer  Tobacco Use   Smoking status: Never   Smokeless tobacco: Never  Vaping Use   Vaping Use: Never used  Substance and Sexual Activity   Alcohol use: Never   Drug use: No   Sexual activity: Yes    Partners: Male  Other Topics Concern   Not on file  Social History Narrative   1 daughter lives in [place],   [place], dtr-in-law, grand-dtr live in [place]   Very active around home   Social Determinants of Health   Financial Resource Strain: Not on file  Food Insecurity: No Food Insecurity (03/09/2021)   Hunger Vital Sign    Worried About Running Out of Food in the Last Year: Never true    Ran Out of Food in the Last Year: Never true  Transportation Needs: Not on file  Physical Activity: Not on file  Stress: No Stress Concern Present (03/02/2020)   [place] of Stress : Not at all  Social Connections: Not on file    Review of Systems  Constitutional:  Negative for chills, fatigue and fever.  HENT:   Negative for congestion, ear pain and sore throat.   Respiratory:  Negative for cough and shortness of breath.   Cardiovascular:  Negative for chest pain and palpitations.  Gastrointestinal:  Positive for diarrhea (occasional loose bowels. Not numerous stools.). Negative for abdominal pain, constipation, nausea and vomiting.  Endocrine: Negative for polydipsia, polyphagia and polyuria.  Genitourinary:  Negative for difficulty urinating and dysuria.  Musculoskeletal:  Negative for arthralgias, back pain and myalgias.  Skin:  Negative for rash.  Neurological:  Negative for headaches.  Psychiatric/Behavioral:  Negative for dysphoric mood. The patient is not nervous/anxious.   Decreased sex drive.    Objective:  BP 110/60   Pulse 65   Temp 97.8 F (36.6 C)   Ht '5\' 4"'$  (1.626 m)   Wt 134 lb (60.8 kg)   SpO2 95%   BMI 23.00 kg/m      12/22/2021   10:12 AM 09/30/2021    8:21 AM 09/30/2021    7:36 AM  BP/Weight  Systolic BP 097 353 299  Diastolic BP 60 66 64  Wt. (Lbs) 134  133.2  BMI 23 kg/m2  22.86 kg/m2    Physical Exam Vitals reviewed.  Constitutional:      Appearance: Normal appearance. She is normal weight.  Neck:     Vascular: No carotid bruit.  Cardiovascular:     Rate and Rhythm: Normal rate and regular rhythm.     Heart sounds: Normal heart sounds.  Pulmonary:     Effort: Pulmonary effort is normal. No respiratory distress.     Breath sounds: Normal breath sounds.  Abdominal:     General: Abdomen is flat. Bowel sounds are normal.     Palpations: Abdomen is soft.     Tenderness: There is no abdominal tenderness.  Neurological:     Mental Status: She is alert and oriented to person, place, and time.  Psychiatric:        Mood and Affect: Mood normal.        Behavior: Behavior normal.     Diabetic Foot Exam - Simple   No data filed      Lab Results  Component Value Date   WBC 4.1 12/19/2021   HGB 14.4 12/19/2021   HCT 43.4 12/19/2021   PLT 199  12/19/2021   GLUCOSE 108 (H) 12/19/2021  CHOL 168 12/19/2021   TRIG 137 12/19/2021   HDL 55 12/19/2021   LDLCALC 89 12/19/2021   ALT 11 12/19/2021   AST 19 12/19/2021   NA 140 12/19/2021   K 4.0 12/19/2021   CL 102 12/19/2021   CREATININE 0.73 12/19/2021   BUN 13 12/19/2021   CO2 23 12/19/2021   TSH 1.400 09/30/2021   HGBA1C 6.2 (H) 12/19/2021      Assessment & Plan:   Problem List Items Addressed This Visit       Cardiovascular and Mediastinum   Essential hypertension, benign - Primary    Well controlled.  No changes to medicines. taking Lisinopril 10 mg take 1 tablet daily, Verapamil 120 mg take 1 tablet twice daily. Continue to work on eating a healthy diet and exercise.          Other   Hyperlipidemia    Well controlled.  No changes to medicines. taking Atorvastatin 10 mg taking 1/2 tablet daily. Continue to work on eating a healthy diet and exercise.        Prediabetes    Hemoglobin A1c 6.2%, 3 month avg of blood sugars, is in prediabetic range.  In order to prevent progression to diabetes, recommend low carb diet and regular exercise       Diarrhea    Recommend carry immodium with her.  Monitor food to determine triggers      Decreased libido    Discussed openness with new partner.  Recommend continued use of lubricant if needed.      .  No orders of the defined types were placed in this encounter.   No orders of the defined types were placed in this encounter.    Follow-up: Return in about 6 months (around 06/22/2022) for awv after 03/10/2021. , chronic fasting.  An After Visit Summary was printed and given to the patient.  [name], MD [facility] [phone]

## 2021-12-22 NOTE — Patient Instructions (Signed)
Recommend shingles vaccines and RSV vaccine at the pharmacy.

## 2021-12-22 NOTE — Assessment & Plan Note (Signed)
Well controlled.  No changes to medicines. taking Atorvastatin 10 mg taking 1/2 tablet daily. Continue to work on eating a healthy diet and exercise.

## 2021-12-22 NOTE — Assessment & Plan Note (Signed)
Hemoglobin A1c 6.2%, 3 month avg of blood sugars, is in prediabetic range.  In order to prevent progression to diabetes, recommend low carb diet and regular exercise 

## 2021-12-22 NOTE — Assessment & Plan Note (Addendum)
Well controlled.  No changes to medicines. taking Lisinopril 10 mg take 1 tablet daily, Verapamil 120 mg take 1 tablet twice daily. Continue to work on eating a healthy diet and exercise.

## 2021-12-22 NOTE — Assessment & Plan Note (Signed)
Discussed openness with new partner.  Recommend continued use of lubricant if needed.

## 2021-12-22 NOTE — Assessment & Plan Note (Signed)
Recommend carry immodium with her.  Monitor food to determine triggers

## 2021-12-26 ENCOUNTER — Other Ambulatory Visit: Payer: Self-pay | Admitting: Family Medicine

## 2022-02-17 ENCOUNTER — Other Ambulatory Visit: Payer: Self-pay | Admitting: Family Medicine

## 2022-02-17 ENCOUNTER — Ambulatory Visit (INDEPENDENT_AMBULATORY_CARE_PROVIDER_SITE_OTHER): Payer: Medicare Other | Admitting: Family Medicine

## 2022-02-17 VITALS — BP 140/78 | HR 76 | Temp 97.2°F | Resp 14 | Ht 63.5 in | Wt 133.2 lb

## 2022-02-17 DIAGNOSIS — K580 Irritable bowel syndrome with diarrhea: Secondary | ICD-10-CM | POA: Diagnosis not present

## 2022-02-17 NOTE — Progress Notes (Unsigned)
Acute Office Visit  Subjective:    Patient ID: Michelle Whitaker, female    DOB: May 28, 1945, 76 y.o.   MRN: [id number]  Chief Complaint  Patient presents with   Diarrhea    HPI: Patient is in today for Diarrhea off and on. Has flare ups. Can go weeks and it is fine, but then will have sudden explosive diarrhea. No new medicines. No nausea or vomiting. Took some kaopectate and it helps, but turned stool black. Happens at most 1-2 times per week, but usually once every 3 weeks. No constipation.   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    Past Surgical History:  Procedure Laterality Date   BREAST EXCISIONAL BIOPSY     BREAST SURGERY     right breast/benign   CATARACT EXTRACTION, BILATERAL     CERVICAL BIOPSY     benign   COLONOSCOPY     DILATION AND CURETTAGE OF UTERUS     due to heavy bleeding   herniated disc  1991   lower back   POLYPECTOMY      Family History  Problem Relation Age of Onset   Heart disease Mother    Stroke Father    Hypertension Sister    Dementia Sister    Colon cancer Neg Hx    Colon polyps Neg Hx    Esophageal cancer Neg Hx    Rectal cancer Neg Hx    Stomach cancer Neg Hx    Breast cancer Neg Hx     Social History   Socioeconomic History   Marital status: Widowed    Spouse name: Not on file   Number of children: 2   Years of education: Not on file   Highest education level: Not on file  Occupational History   Occupation: Retired Regulatory affairs officer  Tobacco Use   Smoking status: Never   Smokeless tobacco: Never  Vaping Use   Vaping Use: Never used  Substance and Sexual Activity   Alcohol use: Never   Drug use: No   Sexual activity: Yes     Partners: Male  Other Topics Concern   Not on file  Social History Narrative   1 daughter lives in [place],   [place], dtr-in-law, grand-dtr live in [place]   Very active around home   Social Determinants of Health   Financial Resource Strain: Not on file  Food Insecurity: No Food Insecurity (03/09/2021)   Hunger Vital Sign    Worried About Running Out of Food in the Last Year: Never true    Ran Out of Food in the Last Year: Never true  Transportation Needs: Not on file  Physical Activity: Not on file  Stress: No Stress Concern Present (03/02/2020)   [place] of Stress : Not at all  Social Connections: Not on file  Intimate Partner Violence: Not on file    Outpatient Medications Prior to Visit  Medication Sig Dispense Refill   albuterol (VENTOLIN HFA) 108 (90 Base) MCG/ACT inhaler Inhale 2 puffs  into the lungs every 6 (six) hours as needed for wheezing or shortness of breath. 8 g 2   aspirin 81 MG tablet Take 81 mg by mouth daily.     atorvastatin (LIPITOR) 10 MG tablet TAKE 1/2 TABLET BY MOUTH EVERY DAY 45 tablet 3   Cholecalciferol (VITAMIN D-3 PO) Take 800 Units by mouth daily.     CINNAMON PO Take 1,000 mg by mouth 2 (two) times daily.     fluticasone (FLONASE) 50 MCG/ACT nasal spray SPRAY 2 SPRAYS INTO EACH NOSTRIL EVERY DAY 48 mL 1   lisinopril (ZESTRIL) 10 MG tablet TAKE 1 TABLET BY MOUTH EVERY DAY 90 tablet 1   loratadine (CLARITIN) 10 MG tablet Take 10 mg by mouth daily.     verapamil (CALAN) 120 MG tablet TAKE 1 TABLET BY MOUTH TWICE A DAY 180 tablet 1   No facility-administered medications prior to visit.    Allergies  Allergen Reactions   Atenolol    Codeine    Erythromycin     REACTION: Nausea   Morphine Other (See Comments)    REACTION: nausea/anxiety   Penicillins    Statins     Causes numbness side face    Review of Systems  Constitutional:  Negative for chills, fatigue  and fever.  HENT:  Negative for congestion, ear pain and sore throat.   Respiratory:  Negative for cough and shortness of breath.   Cardiovascular:  Negative for chest pain.  Gastrointestinal:  Positive for diarrhea. Negative for abdominal pain, constipation, nausea and vomiting.       Objective:    Physical Exam  BP (!) 140/78   Pulse 76   Temp (!) 97.2 F (36.2 C)   Resp 14   Ht 5' 3.5" (1.613 m)   Wt 133 lb 3.2 oz (60.4 kg)   SpO2 98%   BMI 23.23 kg/m  Wt Readings from Last 3 Encounters:  02/17/22 133 lb 3.2 oz (60.4 kg)  12/22/21 134 lb (60.8 kg)  09/30/21 133 lb 3.2 oz (60.4 kg)    Health Maintenance Due  Topic Date Due   Hepatitis C Screening  Never done   Zoster Vaccines- Shingrix (1 of 2) Never done   Medicare Annual Wellness (AWV)  03/09/2022    There are no preventive care reminders to display for this patient.   Lab Results  Component Value Date   TSH 1.400 09/30/2021   Lab Results  Component Value Date   WBC 4.1 12/19/2021   HGB 14.4 12/19/2021   HCT 43.4 12/19/2021   MCV 90 12/19/2021   PLT 199 12/19/2021   Lab Results  Component Value Date   NA 140 12/19/2021   K 4.0 12/19/2021   CO2 23 12/19/2021   GLUCOSE 108 (H) 12/19/2021   BUN 13 12/19/2021   CREATININE 0.73 12/19/2021   BILITOT 0.7 12/19/2021   ALKPHOS 82 12/19/2021   AST 19 12/19/2021   ALT 11 12/19/2021   PROT 6.6 12/19/2021   ALBUMIN 4.4 12/19/2021   CALCIUM 9.2 12/19/2021   EGFR 85 12/19/2021   Lab Results  Component Value Date   CHOL 168 12/19/2021   Lab Results  Component Value Date   HDL 55 12/19/2021   Lab Results  Component Value Date   LDLCALC 89 12/19/2021   Lab Results  Component Value Date   TRIG 137 12/19/2021   Lab Results  Component Value Date   CHOLHDL 3.6 06/13/2021   Lab Results  Component Value Date  HGBA1C 6.2 (H) 12/19/2021       Assessment & Plan:   Problem List Items Addressed This Visit   None  No orders of the defined types  were placed in this encounter.   No orders of the defined types were placed in this encounter.    Follow-up: No follow-ups on file.  An After Visit Summary was printed and given to the patient.  [name], MD [facility] [phone]

## 2022-02-21 ENCOUNTER — Encounter: Payer: Self-pay | Admitting: Family Medicine

## 2022-02-21 DIAGNOSIS — K580 Irritable bowel syndrome with diarrhea: Secondary | ICD-10-CM | POA: Insufficient documentation

## 2022-02-21 NOTE — Assessment & Plan Note (Signed)
Referred to GI. Recommended to take fiber supplements and probiotics OTC.

## 2022-03-14 ENCOUNTER — Ambulatory Visit (INDEPENDENT_AMBULATORY_CARE_PROVIDER_SITE_OTHER): Payer: Medicare Other

## 2022-03-14 VITALS — BP 132/78 | HR 73 | Resp 16 | Ht 63.5 in | Wt 135.8 lb

## 2022-03-14 DIAGNOSIS — Z1231 Encounter for screening mammogram for malignant neoplasm of breast: Secondary | ICD-10-CM | POA: Diagnosis not present

## 2022-03-14 DIAGNOSIS — Z0000 Encounter for general adult medical examination without abnormal findings: Secondary | ICD-10-CM

## 2022-03-14 NOTE — Progress Notes (Signed)
Subjective:   Michelle Whitaker is a 77 y.o. female who presents for Medicare Annual (Subsequent) preventive examination.  This wellness visit is conducted by a nurse.  The patient's medications were reviewed and reconciled since the patient's last visit.  History details were provided by the patient.  The history appears to be reliable.    Patient's last AWV was one year ago.   Medical History: Patient history and Family history was reviewed  Medications, Allergies, and preventative health maintenance was reviewed and updated.  Cardiac Risk Factors include: advanced age (>29mn, >>67women);dyslipidemia     Objective:    Today's Vitals   03/14/22 0901  BP: 132/78  Pulse: 73  Resp: 16  SpO2: 97%  Weight: 135 lb 12.8 oz (61.6 kg)  Height: 5' 3.5" (1.613 m)   Body mass index is 23.68 kg/m.     03/09/2021   10:08 AM 03/02/2020    3:59 PM  Advanced Directives  Does Patient Have a Medical Advance Directive? Yes Yes  Type of [place]amed[place]of [place] will [place] Does patient want to make changes to medical advance directive? No - Patient declined   Copy of [place] Chart? No - copy requested No - copy requested    Current Medications (verified) Outpatient Encounter Medications as of 03/14/2022  Medication Sig   albuterol (VENTOLIN HFA) 108 (90 Base) MCG/ACT inhaler Inhale 2 puffs into the lungs every 6 (six) hours as needed for wheezing or shortness of breath.   aspirin 81 MG tablet Take 81 mg by mouth daily.   atorvastatin (LIPITOR) 10 MG tablet TAKE 1/2 TABLET BY MOUTH EVERY DAY   Cholecalciferol (VITAMIN D-3 PO) Take 800 Units by mouth daily.   CINNAMON PO Take 1,000 mg by mouth 2 (two) times daily.   fluticasone (FLONASE) 50 MCG/ACT nasal spray SPRAY 2 SPRAYS INTO EACH NOSTRIL EVERY DAY   lisinopril (ZESTRIL) 10 MG tablet TAKE 1 TABLET BY MOUTH EVERY DAY   loratadine (CLARITIN) 10 MG tablet Take 10 mg by  mouth daily.   verapamil (CALAN) 120 MG tablet TAKE 1 TABLET BY MOUTH TWICE A DAY   No facility-administered encounter medications on file as of 03/14/2022.    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   Past Surgical History:  Procedure Laterality Date   BREAST EXCISIONAL BIOPSY     BREAST SURGERY     right breast/benign   CATARACT EXTRACTION, BILATERAL     CERVICAL BIOPSY     benign   COLONOSCOPY     DILATION AND CURETTAGE OF UTERUS     due to heavy bleeding   herniated disc  1991   lower back   POLYPECTOMY     Family History  Problem Relation Age of Onset   Heart disease Mother    Stroke Father    Hypertension Sister    Dementia Sister    Colon cancer Neg Hx    Colon polyps  Neg Hx    Esophageal cancer Neg Hx    Rectal cancer Neg Hx    Stomach cancer Neg Hx    Breast cancer Neg Hx    Social History   Socioeconomic History   Marital status: Widowed    Spouse name: Not on file   Number of children: 2   Years of education: Not on file   Highest education level: Not on file  Occupational History   Occupation: Retired Regulatory affairs officer  Tobacco Use   Smoking status: Never   Smokeless tobacco: Never  Vaping Use   Vaping Use: Never used  Substance and Sexual Activity   Alcohol use: Never   Drug use: No   Sexual activity: Not Currently    Partners: Male  Other Topics Concern   Not on file  Social History Narrative   1 daughter lives in [place],   [place], dtr-in-law, grand-dtr live in [place]   Very active around home   Social Determinants of Health   Financial  Resource Strain: [place]  (03/14/2022)   Overall Financial Resource Strain (CARDIA)    Difficulty of Paying Living Expenses: Not hard at all  Food Insecurity: No Food Insecurity (03/14/2022)   Hunger Vital Sign    Worried About Running Out of Food in the Last Year: Never true    Juab in the Last Year: Never true  Transportation Needs: No Transportation Needs (03/14/2022)   PRAPARE - Hydrologist (Medical): No    Lack of Transportation (Non-Medical): No  Physical Activity: Insufficiently Active (03/14/2022)   Exercise Vital Sign    Days of Exercise per Week: 6 days    Minutes of Exercise per Session: 20 min  Stress: No Stress Concern Present (03/14/2022)   [place]    Feeling of Stress : Not at all  Social Connections: Moderately Integrated (03/14/2022)   Social Connection and Isolation Panel [NHANES]    Frequency of Communication with Friends and Family: More than three times a week    Frequency of Social Gatherings with Friends and Family: Three times a week    Attends Religious Services: More than 4 times per year    Active Member of Clubs or Organizations: Yes    Attends Archivist Meetings: More than 4 times per year    Marital Status: Widowed    Tobacco Counseling Counseling given: Not Answered   Clinical Intake:  Pre-visit preparation completed: Yes Pain : No/denies pain   BMI - recorded: 23.68 Nutritional Status: BMI of 19-24  Normal Nutritional Risks: None Diabetes: No How often do you need to have someone help you when you read instructions, pamphlets, or other written materials from your doctor or pharmacy?: 1 - Never Interpreter Needed?: No    Activities of Daily Living    03/14/2022    9:09 AM  In your present state of health, do you have any difficulty performing the following activities:  Hearing? 0  Vision? 0  Difficulty concentrating or  making decisions? 0  Walking or climbing stairs? 0  Dressing or bathing? 0  Doing errands, shopping? 0  Preparing Food and eating ? N  Using the Toilet? N  In the past six months, have you accidently leaked urine? N  Do you have problems with loss of bowel control? Y  Managing your Medications? N  Managing your Finances? N  Housekeeping or managing your Housekeeping? N  Patient Care Team: [name], MD as PCP - General (Family Medicine) Ishmael Holter, OD (Optometry)     Assessment:   This is a routine wellness examination for Michelle Whitaker.  Dietary issues and exercise activities discussed: Current Exercise Habits: Home exercise routine, Type of exercise: walking, Time (Minutes): 20, Frequency (Times/Week): 6, Weekly Exercise (Minutes/Week): 120, Intensity: Mild, Exercise limited by: None identified   Depression Screen    03/14/2022    9:05 AM 09/30/2021    7:42 AM 03/09/2021   10:05 AM 09/08/2020    9:23 AM 03/09/2020    8:02 AM 03/02/2020    4:01 PM  PHQ 2/9 Scores  PHQ - 2 Score 0 0 0 0 0 0    Fall Risk    03/14/2022    9:09 AM 09/30/2021    7:42 AM 03/09/2021   10:09 AM 09/08/2020    9:22 AM 03/09/2020    8:01 AM  Fall Risk   Falls in the past year? 0 0 0 0 0  Number falls in past yr: 0 0 0 0 0  Injury with Fall? 0 0 0 0 0  Risk for fall due to : No Fall Risks No Fall Risks No Fall Risks    Follow up Falls evaluation completed;Education provided Falls evaluation completed Falls evaluation completed;Falls prevention discussed      FALL RISK PREVENTION PERTAINING TO THE HOME:  Any stairs in or around the home? Yes  If so, are there any without handrails? No  Home free of loose throw rugs in walkways, pet beds, electrical cords, etc? Yes  Adequate lighting in your home to reduce risk of falls? Yes   ASSISTIVE DEVICES UTILIZED TO PREVENT FALLS:  Life alert? No  Use of a cane, walker or w/c? No   Gait steady and fast without use of assistive device  Cognitive  Function:        03/14/2022    9:10 AM 03/09/2021   10:10 AM  6CIT Screen  What Year? 0 points 0 points  What month? 0 points 0 points  What time? 0 points 0 points  Count back from 20 0 points 0 points  Months in reverse 0 points 0 points  Repeat phrase 0 points 0 points  Total Score 0 points 0 points    Immunizations Immunization History  Administered Date(s) Administered   Fluad Quad(high Dose 65+) 11/03/2019, 11/16/2020, 12/22/2021   Moderna Sars-Covid-2 Vaccination 03/28/2019, 04/23/2019, 12/19/2019   Pneumococcal Conjugate-13 01/24/2014   Pneumococcal Polysaccharide-23 12/02/2010   Tdap 02/21/2015    TDAP status: Up to date  Flu Vaccine status: Up to date  Pneumococcal vaccine status: Up to date  Covid-19 vaccine status: Declined, Education has been provided regarding the importance of this vaccine but patient still declined. Advised may receive this vaccine at local pharmacy or Health Dept.or vaccine clinic. Aware to provide a copy of the vaccination record if obtained from local pharmacy or Health Dept. Verbalized acceptance and understanding.  Qualifies for Shingles Vaccine? Yes   Zostavax completed No   Shingrix Completed?: No.    Education has been provided regarding the importance of this vaccine. Patient has been advised to call insurance company to determine out of pocket expense if they have not yet received this vaccine. Advised may also receive vaccine at local pharmacy or Health Dept. Verbalized acceptance and understanding.  Screening Tests Health Maintenance  Topic Date Due   Hepatitis C Screening  Never done   Zoster Vaccines- Shingrix (1 of 2)  Never done   Medicare Annual Wellness (AWV)  03/09/2022   COVID-19 Vaccine (4 - 2023-24 season) 11/21/2022 (Originally 10/21/2021)   DTaP/Tdap/Td (2 - Td or Tdap) 02/20/2025   Pneumonia Vaccine 31+ Years old  Completed   INFLUENZA VACCINE  Completed   DEXA SCAN  Completed   HPV VACCINES  Aged Out    COLONOSCOPY (Pts 45-68yr Insurance coverage will need to be confirmed)  Discontinued    Health Maintenance  Health Maintenance Due  Topic Date Due   Hepatitis C Screening  Never done   Zoster Vaccines- Shingrix (1 of 2) Never done   Medicare Annual Wellness (AWV)  03/09/2022    Colorectal cancer screening: No longer required.   Mammogram status: Scheduled on the bus for Feb 7  Bone Density status: Completed 04/09/20. Results reflect: Bone density results: OSTEOPOROSIS. Repeat every 2 years.  Lung Cancer Screening: (Low Dose CT Chest recommended if Age 78-80years, 30 pack-year currently smoking OR have quit w/in 15years.) does not qualify.   Lung Cancer Screening Referral: N/A  Additional Screening:  Vision Screening: Recommended annual ophthalmology exams for early detection of glaucoma and other disorders of the eye. Is the patient up to date with their annual eye exam?  Yes  Who is the provider or what is the name of the office in which the patient attends annual eye exams? [facility]: Recommended annual dental exams for proper oral hygiene  Community Resource Referral / Chronic Care Management: CRR required this visit?  No   CCM required this visit?  No      Plan:    Exercise - increase physical activity  I have personally reviewed and noted the following in the patient's chart:   Medical and social history Use of alcohol, tobacco or illicit drugs  Current medications and supplements including opioid prescriptions. Patient is not currently taking opioid prescriptions. Functional ability and status Nutritional status Physical activity Advanced directives List of other physicians Hospitalizations, surgeries, and ER visits in previous 12 months Vitals Screenings to include cognitive, depression, and falls Referrals and appointments  In addition, I have reviewed and discussed with patient certain preventive protocols, quality metrics, and  best practice recommendations. A written personalized care plan for preventive services as well as general preventive health recommendations were provided to patient.     KErie Noe LPN   11/85/6314

## 2022-03-14 NOTE — Patient Instructions (Signed)
Michelle Whitaker , Thank you for taking time to come for your Medicare Wellness Visit. I appreciate your ongoing commitment to your health goals. Please review the following plan we discussed and let me know if I can assist you in the future.   Screening recommendations/referrals: Colonoscopy: No longer required Mammogram: Scheduled for 03/29/22 Bone Density: Done 04/09/20 - declined screening at this time Recommended yearly ophthalmology/optometry visit for glaucoma screening and checkup Recommended yearly dental visit for hygiene and checkup  Vaccinations: Influenza vaccine: up-to-date Pneumococcal vaccine: Complete Tdap vaccine: Due 2027 Shingles vaccine: Due - can get at the pharmacy    Advanced directives: Please bring a copy for your medical record   Preventive Care 65 Years and Older, Female   Preventive care refers to lifestyle choices and visits with your health care provider that can promote health and wellness.  What does preventive care include? A yearly physical exam. This is also called an annual well check. Dental exams once or twice a year. Routine eye exams. Ask your health care provider how often you should have your eyes checked. Personal lifestyle choices, including: Daily care of your teeth and gums. Regular physical activity. Eating a healthy diet. Avoiding tobacco and drug use. Limiting alcohol use. Practicing safe sex. Taking low-dose aspirin every day. Taking vitamin and mineral supplements as recommended by your health care provider.  What happens during an annual well check? The services and screenings done by your health care provider during your annual well check will depend on your age, overall health, lifestyle risk factors, and family history of disease.  Counseling Your health care provider may ask you questions about your: Alcohol use. Tobacco use. Drug use. Emotional well-being. Home and relationship well-being. Sexual activity. Eating  habits. History of falls. Memory and ability to understand (cognition). Work and work Statistician. Reproductive health.  Screening You may have the following tests or measurements: Height, weight, and BMI. Blood pressure. Lipid and cholesterol levels. These may be checked every 5 years, or more frequently if you are over 32 years old. Skin check. Lung cancer screening. You may have this screening every year starting at age 71 if you have a 30-pack-year history of smoking and currently smoke or have quit within the past 15 years. Fecal occult blood test (FOBT) of the stool. You may have this test every year starting at age 65. Flexible sigmoidoscopy or colonoscopy. You may have a sigmoidoscopy every 5 years or a colonoscopy every 10 years starting at age 62. Hepatitis C blood test. Hepatitis B blood test. Sexually transmitted disease (STD) testing. Diabetes screening. This is done by checking your blood sugar (glucose) after you have not eaten for a while (fasting). You may have this done every 1-3 years. Bone density scan. This is done to screen for osteoporosis. You may have this done starting at age 25. Mammogram. This may be done every 1-2 years. Talk to your health care provider about how often you should have regular mammograms. Talk with your health care provider about your test results, treatment options, and if necessary, the need for more tests.  Vaccines Your health care provider may recommend certain vaccines, such as: Influenza vaccine. This is recommended every year. Tetanus, diphtheria, and acellular pertussis (Tdap, Td) vaccine. You may need a Td booster every 10 years. Zoster vaccine. You may need this after age 54. Pneumococcal 13-valent conjugate (PCV13) vaccine. One dose is recommended after age 34. Pneumococcal polysaccharide (PPSV23) vaccine. One dose is recommended after age 15. Talk to  your health care provider about which screenings and vaccines you need and how  often you need them.  This information is not intended to replace advice given to you by your health care provider. Make sure you discuss any questions you have with your health care provider. Document Released: 03/05/2015 Document Revised: 10/27/2015 Document Reviewed: 12/08/2014 Elsevier Interactive Patient Education  2017 [place] Prevention in the Home  Falls can cause injuries. They can happen to people of all ages. There are many things you can do to make your home safe and to help prevent falls.  What can I do on the outside of my home? Regularly fix the edges of walkways and driveways and fix any cracks. Remove anything that might make you trip as you walk through a door, such as a raised step or threshold. Trim any bushes or trees on the path to your home. Use bright outdoor lighting. Clear any walking paths of anything that might make someone trip, such as rocks or tools. Regularly check to see if handrails are loose or broken. Make sure that both sides of any steps have handrails. Any raised decks and porches should have guardrails on the edges. Have any leaves, snow, or ice cleared regularly. Use sand or salt on walking paths during winter. Clean up any spills in your garage right away. This includes oil or grease spills.  What can I do in the bathroom? Use night lights. Install grab bars by the toilet and in the tub and shower. Do not use towel bars as grab bars. Use non-skid mats or decals in the tub or shower. If you need to sit down in the shower, use a plastic, non-slip stool. Keep the floor dry. Clean up any water that spills on the floor as soon as it happens. Remove soap buildup in the tub or shower regularly. Attach bath mats securely with double-sided non-slip rug tape. Do not have throw rugs and other things on the floor that can make you trip.  What can I do in the bedroom? Use night lights. Make sure that you have a light by your bed that is  easy to reach. Do not use any sheets or blankets that are too big for your bed. They should not hang down onto the floor. Have a firm chair that has side arms. You can use this for support while you get dressed. Do not have throw rugs and other things on the floor that can make you trip.  What can I do in the kitchen? Clean up any spills right away. Avoid walking on wet floors. Keep items that you use a lot in easy-to-reach places. If you need to reach something above you, use a strong step stool that has a grab bar. Keep electrical cords out of the way. Do not use floor polish or wax that makes floors slippery. If you must use wax, use non-skid floor wax. Do not have throw rugs and other things on the floor that can make you trip.  What can I do with my stairs? Do not leave any items on the stairs. Make sure that there are handrails on both sides of the stairs and use them. Fix handrails that are broken or loose. Make sure that handrails are as long as the stairways. Check any carpeting to make sure that it is firmly attached to the stairs. Fix any carpet that is loose or worn. Avoid having throw rugs at the top or bottom of the  stairs. If you do have throw rugs, attach them to the floor with carpet tape. Make sure that you have a light switch at the top of the stairs and the bottom of the stairs. If you do not have them, ask someone to add them for you.  What else can I do to help prevent falls? Wear shoes that: Do not have high heels. Have rubber bottoms. Are comfortable and fit you well. Are closed at the toe. Do not wear sandals. If you use a stepladder: Make sure that it is fully opened. Do not climb a closed stepladder. Make sure that both sides of the stepladder are locked into place. Ask someone to hold it for you, if possible. Clearly mark and make sure that you can see: Any grab bars or handrails. First and last steps. Where the edge of each step is. Use tools that help  you move around (mobility aids) if they are needed. These include: Canes. Walkers. Scooters. Crutches. Turn on the lights when you go into a dark area. Replace any light bulbs as soon as they burn out. Set up your furniture so you have a clear path. Avoid moving your furniture around. If any of your floors are uneven, fix them. If there are any pets around you, be aware of where they are. Review your medicines with your doctor. Some medicines can make you feel dizzy. This can increase your chance of falling. Ask your doctor what other things that you can do to help prevent falls.  This information is not intended to replace advice given to you by your health care provider. Make sure you discuss any questions you have with your health care provider. Document Released: 12/03/2008 Document Revised: 07/15/2015 Document Reviewed: 03/13/2014 Elsevier Interactive Patient Education  2017 [employer].

## 2022-03-22 ENCOUNTER — Other Ambulatory Visit: Payer: Self-pay | Admitting: Family Medicine

## 2022-03-29 ENCOUNTER — Ambulatory Visit
Admission: RE | Admit: 2022-03-29 | Discharge: 2022-03-29 | Disposition: A | Discharge status: Home / Self Care | Payer: Medicare Other | Source: Ambulatory Visit | Attending: Family Medicine | Admitting: Family Medicine

## 2022-03-29 DIAGNOSIS — Z1231 Encounter for screening mammogram for malignant neoplasm of breast: Secondary | ICD-10-CM | POA: Diagnosis not present

## 2022-05-19 ENCOUNTER — Other Ambulatory Visit: Payer: Self-pay | Admitting: Family Medicine

## 2022-06-08 DIAGNOSIS — R195 Other fecal abnormalities: Secondary | ICD-10-CM | POA: Diagnosis not present

## 2022-06-08 DIAGNOSIS — Z86010 Personal history of colonic polyps: Secondary | ICD-10-CM | POA: Diagnosis not present

## 2022-06-12 DIAGNOSIS — R197 Diarrhea, unspecified: Secondary | ICD-10-CM | POA: Diagnosis not present

## 2022-06-22 DIAGNOSIS — K648 Other hemorrhoids: Secondary | ICD-10-CM | POA: Diagnosis not present

## 2022-06-22 DIAGNOSIS — K635 Polyp of colon: Secondary | ICD-10-CM | POA: Diagnosis not present

## 2022-06-22 DIAGNOSIS — D126 Benign neoplasm of colon, unspecified: Secondary | ICD-10-CM | POA: Diagnosis not present

## 2022-06-22 DIAGNOSIS — K5730 Diverticulosis of large intestine without perforation or abscess without bleeding: Secondary | ICD-10-CM | POA: Diagnosis not present

## 2022-06-22 DIAGNOSIS — D122 Benign neoplasm of ascending colon: Secondary | ICD-10-CM | POA: Diagnosis not present

## 2022-06-22 DIAGNOSIS — Z86010 Personal history of colonic polyps: Secondary | ICD-10-CM | POA: Diagnosis not present

## 2022-06-25 ENCOUNTER — Other Ambulatory Visit: Payer: Self-pay

## 2022-06-25 DIAGNOSIS — R7303 Prediabetes: Secondary | ICD-10-CM

## 2022-06-25 DIAGNOSIS — I10 Essential (primary) hypertension: Secondary | ICD-10-CM

## 2022-06-25 DIAGNOSIS — E782 Mixed hyperlipidemia: Secondary | ICD-10-CM

## 2022-06-26 ENCOUNTER — Other Ambulatory Visit: Payer: BLUE CROSS/BLUE SHIELD

## 2022-06-26 DIAGNOSIS — E782 Mixed hyperlipidemia: Secondary | ICD-10-CM

## 2022-06-26 DIAGNOSIS — I10 Essential (primary) hypertension: Secondary | ICD-10-CM | POA: Diagnosis not present

## 2022-06-26 DIAGNOSIS — R7303 Prediabetes: Secondary | ICD-10-CM

## 2022-06-27 LAB — LIPID PANEL
Chol/HDL Ratio: 3.1 ratio (ref 0.0–4.4)
Cholesterol, Total: 171 mg/dL (ref 100–199)
HDL: 55 mg/dL (ref >39)
LDL Chol Calc (NIH): 93 mg/dL (ref 0–99)
Triglycerides: 131 mg/dL (ref 0–149)
VLDL Cholesterol Cal: 23 mg/dL (ref 5–40)

## 2022-06-27 LAB — COMPREHENSIVE METABOLIC PANEL
ALT: 17 IU/L (ref 0–32)
AST: 20 IU/L (ref 0–40)
Albumin/Globulin Ratio: 1.8 (ref 1.2–2.2)
Albumin: 4.2 g/dL (ref 3.8–4.8)
Alkaline Phosphatase: 92 IU/L (ref 44–121)
BUN/Creatinine Ratio: 20 (ref 12–28)
BUN: 16 mg/dL (ref 8–27)
Bilirubin Total: 0.5 mg/dL (ref 0.0–1.2)
CO2: 23 mmol/L (ref 20–29)
Calcium: 9.6 mg/dL (ref 8.7–10.3)
Chloride: 105 mmol/L (ref 96–106)
Creatinine, Ser: 0.79 mg/dL (ref 0.57–1.00)
Globulin, Total: 2.4 g/dL (ref 1.5–4.5)
Glucose: 101 mg/dL — ABNORMAL HIGH (ref 70–99)
Potassium: 4.2 mmol/L (ref 3.5–5.2)
Sodium: 144 mmol/L (ref 134–144)
Total Protein: 6.6 g/dL (ref 6.0–8.5)
eGFR: 77 mL/min/{1.73_m2} (ref >59)

## 2022-06-27 LAB — CBC WITH DIFFERENTIAL/PLATELET
Basophils Absolute: 0 10*3/uL (ref 0.0–0.2)
Basos: 1 %
EOS (ABSOLUTE): 0.1 10*3/uL (ref 0.0–0.4)
Eos: 3 %
Hematocrit: 41.6 % (ref 34.0–46.6)
Hemoglobin: 14 g/dL (ref 11.1–15.9)
Immature Grans (Abs): 0 10*3/uL (ref 0.0–0.1)
Immature Granulocytes: 0 %
Lymphocytes Absolute: 1.3 10*3/uL (ref 0.7–3.1)
Lymphs: 34 %
MCH: 29.7 pg (ref 26.6–33.0)
MCHC: 33.7 g/dL (ref 31.5–35.7)
MCV: 88 fL (ref 79–97)
Monocytes Absolute: 0.4 10*3/uL (ref 0.1–0.9)
Monocytes: 11 %
Neutrophils Absolute: 2 10*3/uL (ref 1.4–7.0)
Neutrophils: 51 %
Platelets: 192 10*3/uL (ref 150–450)
RBC: 4.71 x10E6/uL (ref 3.77–5.28)
RDW: 12.5 % (ref 11.7–15.4)
WBC: 3.9 10*3/uL (ref 3.4–10.8)

## 2022-06-27 LAB — HEMOGLOBIN A1C
Est. average glucose Bld gHb Est-mCnc: 131 mg/dL
Hgb A1c MFr Bld: 6.2 % — ABNORMAL HIGH (ref 4.8–5.6)

## 2022-06-27 LAB — CARDIOVASCULAR RISK ASSESSMENT

## 2022-06-28 ENCOUNTER — Encounter: Payer: Self-pay | Admitting: Family Medicine

## 2022-06-29 ENCOUNTER — Ambulatory Visit (INDEPENDENT_AMBULATORY_CARE_PROVIDER_SITE_OTHER): Payer: Medicare Other | Admitting: Family Medicine

## 2022-06-29 VITALS — BP 144/72 | HR 72 | Temp 97.3°F | Resp 14 | Ht 63.75 in | Wt 134.0 lb

## 2022-06-29 DIAGNOSIS — I10 Essential (primary) hypertension: Secondary | ICD-10-CM | POA: Diagnosis not present

## 2022-06-29 DIAGNOSIS — M818 Other osteoporosis without current pathological fracture: Secondary | ICD-10-CM

## 2022-06-29 DIAGNOSIS — E782 Mixed hyperlipidemia: Secondary | ICD-10-CM

## 2022-06-29 DIAGNOSIS — J4520 Mild intermittent asthma, uncomplicated: Secondary | ICD-10-CM

## 2022-06-29 DIAGNOSIS — R7303 Prediabetes: Secondary | ICD-10-CM

## 2022-06-29 NOTE — Progress Notes (Signed)
Subjective:  Patient ID: Michelle Whitaker, female    DOB: 1945-10-19  Age: 77 y.o. MRN: [id number]  Chief Complaint  Patient presents with   Medical Management of Chronic Issues     HPI Hyperlipidemia:  Patient is currently taking Atorvastatin 10 mg taking 1/2 tablet daily. Eats healthy most of the time. Exercising Walks at [employer]. 2-3 miles a couple of times a week.    Hypertension: Patient is currently taking Lisinopril 10 mg take 1 tablet daily, Verapamil 120 mg take 1 tablet twice daily.   Prediabetes: Last A1c: 6.2%   Asthma, mild, intermittent: continue albuterol HFA 2 puffs four times a day as needed. Rarely needs albuterol hfa.    Allergies: restarted claritin and using flonase. Helping.   Diarrhea: episodes of diarrhea recently.  She had GI work-up with Michelle Whitaker last week, including colonoscopy.  She had a small polyp and was started on metamucil.         06/29/2022    8:54 AM 03/14/2022    9:05 AM 09/30/2021    7:42 AM 03/09/2021   10:05 AM 09/08/2020    9:23 AM  Depression screen PHQ 2/9  Decreased Interest 0 0 0 0 0  Down, Depressed, Hopeless 0 0 0 0 0  PHQ - 2 Score 0 0 0 0 0        06/29/2022    8:54 AM  Fall Risk   Falls in the past year? 0  Number falls in past yr: 0  Injury with Fall? 0  Risk for fall due to : No Fall Risks  Follow up Falls evaluation completed;Falls prevention discussed    Patient Care Team: Lindler, Fritzi Mandes, MD as PCP - General (Family Medicine) Michelle Whitaker, OD (Optometry)   Review of Systems  Constitutional:  Negative for chills, fatigue and fever.  HENT:  Negative for congestion, rhinorrhea and sore throat.   Respiratory:  Negative for cough and shortness of breath.   Cardiovascular:  Negative for chest pain.  Gastrointestinal:  Positive for diarrhea (Had GI work-up with Michelle Whitaker). Negative for abdominal pain, constipation, nausea and vomiting.       Intermittent periods of gas and diarrhea.    Genitourinary:  Negative  for dysuria and urgency.  Musculoskeletal:  Positive for back pain. Negative for myalgias.  Neurological:  Negative for dizziness, weakness, light-headedness and headaches.  Psychiatric/Behavioral:  Negative for dysphoric mood. The patient is not nervous/anxious.     Current Outpatient Medications on File Prior to Visit  Medication Sig Dispense Refill   albuterol (VENTOLIN HFA) 108 (90 Base) MCG/ACT inhaler Inhale 2 puffs into the lungs every 6 (six) hours as needed for wheezing or shortness of breath. 8 g 2   aspirin 81 MG tablet Take 81 mg by mouth daily.     atorvastatin (LIPITOR) 10 MG tablet TAKE 1/2 TABLET BY MOUTH EVERY DAY 45 tablet 3   Cholecalciferol (VITAMIN D-3 PO) Take 800 Units by mouth daily.     fluticasone (FLONASE) 50 MCG/ACT nasal spray SPRAY 2 SPRAYS INTO EACH NOSTRIL EVERY DAY 48 mL 1   lisinopril (ZESTRIL) 10 MG tablet TAKE 1 TABLET BY MOUTH EVERY DAY 90 tablet 1   loratadine (CLARITIN) 10 MG tablet Take 10 mg by mouth daily.     Metamucil Fiber CHEW Chew by mouth in the morning and at bedtime.     verapamil (CALAN) 120 MG tablet TAKE 1 TABLET BY MOUTH TWICE A DAY 180 tablet 0  No current facility-administered medications on file prior to visit.   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   Past Surgical History:  Procedure Laterality Date   BREAST EXCISIONAL BIOPSY     BREAST SURGERY     right breast/benign   CATARACT EXTRACTION, BILATERAL     CERVICAL BIOPSY     benign   COLONOSCOPY     DILATION AND CURETTAGE OF UTERUS     due to heavy bleeding   herniated disc  1991   lower back   POLYPECTOMY      Family History  Problem Relation Age of Onset   Heart  disease Mother    Stroke Father    Hypertension Sister    Dementia Sister    Colon cancer Neg Hx    Colon polyps Neg Hx    Esophageal cancer Neg Hx    Rectal cancer Neg Hx    Stomach cancer Neg Hx    Breast cancer Neg Hx    Social History   Socioeconomic History   Marital status: Widowed    Spouse name: Not on file   Number of children: 2   Years of education: Not on file   Highest education level: Not on file  Occupational History   Occupation: Retired Neurosurgeon  Tobacco Use   Smoking status: Never   Smokeless tobacco: Never  Vaping Use   Vaping Use: Never used  Substance and Sexual Activity   Alcohol use: Never   Drug use: No   Sexual activity: Not Currently    Partners: Male  Other Topics Concern   Not on file  Social History Narrative   1 daughter lives in [place],   [place], dtr-in-law, grand-dtr live in [place]   Very active around home   Social Determinants of Health   Financial Resource Strain: Low Risk  (03/14/2022)   Overall Financial Resource Strain (CARDIA)    Difficulty of Paying Living Expenses: Not hard at all  Food Insecurity: No Food Insecurity (03/14/2022)   Hunger Vital Sign    Worried About Running Out of Food in the Last Year: Never true    Ran Out of Food in the Last Year: Never true  Transportation Needs: No Transportation Needs (03/14/2022)   PRAPARE - Administrator, Civil Service (Medical): No    Lack of Transportation (Non-Medical): No  Physical Activity: Insufficiently Active (03/14/2022)   Exercise Vital Sign    Days of Exercise per Week: 6 days    Minutes of Exercise per Session: 20 min  Stress: No Stress Concern Present (03/14/2022)   [employer] of Occupational Health - Occupational Stress Questionnaire    Feeling of Stress : Not at all  Social Connections: Moderately Integrated (03/14/2022)   Social Connection and Isolation Panel [NHANES]    Frequency of Communication with Friends and Family: More than three  times a week    Frequency of Social Gatherings with Friends and Family: Three times a week    Attends Religious Services: More than 4 times per year    Active  Member of Clubs or Organizations: Yes    Attends Engineer, structural: More than 4 times per year    Marital Status: Widowed    Objective:  BP (!) 144/72   Pulse 72   Temp (!) 97.3 F (36.3 C)   Resp 14   Ht 5' 3.75" (1.619 m)   Wt 134 lb (60.8 kg)   SpO2 98%   BMI 23.18 kg/m      06/29/2022    9:28 AM 06/29/2022    8:46 AM 03/14/2022    9:01 AM  BP/Weight  Systolic BP 144 140 132  Diastolic BP 72 70 78  Wt. (Lbs)  134 135.8  BMI  23.18 kg/m2 23.68 kg/m2    Physical Exam Vitals reviewed.  Constitutional:      Appearance: Normal appearance. She is normal weight.  Neck:     Vascular: No carotid bruit.  Cardiovascular:     Rate and Rhythm: Normal rate and regular rhythm.     Heart sounds: Normal heart sounds.  Pulmonary:     Effort: Pulmonary effort is normal. No respiratory distress.     Breath sounds: Normal breath sounds.  Abdominal:     General: Abdomen is flat. Bowel sounds are normal.     Palpations: Abdomen is soft.     Tenderness: There is no abdominal tenderness.  Neurological:     Mental Status: She is alert and oriented to person, place, and time.  Psychiatric:        Mood and Affect: Mood normal.        Behavior: Behavior normal.     Diabetic Foot Exam - Simple   No data filed      Lab Results  Component Value Date   WBC 3.9 06/26/2022   HGB 14.0 06/26/2022   HCT 41.6 06/26/2022   PLT 192 06/26/2022   GLUCOSE 101 (H) 06/26/2022   CHOL 171 06/26/2022   TRIG 131 06/26/2022   HDL 55 06/26/2022   LDLCALC 93 06/26/2022   ALT 17 06/26/2022   AST 20 06/26/2022   NA 144 06/26/2022   K 4.2 06/26/2022   CL 105 06/26/2022   CREATININE 0.79 06/26/2022   BUN 16 06/26/2022   CO2 23 06/26/2022   TSH 1.400 09/30/2021   HGBA1C 6.2 (H) 06/26/2022      Assessment & Plan:    Mixed  hyperlipidemia Assessment & Plan: Well controlled.  No changes to medicines. Continue atorvastatin 10 mg before bed.  Continue to work on eating a healthy diet and exercise.     Orders: -     CBC with Differential/Platelet; Future -     Comprehensive metabolic panel; Future -     Lipid panel; Future -     TSH; Future -     VITAMIN D 25 Hydroxy (Vit-D Deficiency, Fractures); Future  Essential hypertension, benign Assessment & Plan: Not at goal Increase lisinopril to 20 mg daily. Recheck bp and cimp in the office in 2 weeks.   Continue Verapamil 120 mg take 1 tablet twice daily. Continue to work on eating a healthy diet and exercise.  Labs reviewed.    Prediabetes Assessment & Plan: Recommend continue to work on eating healthy diet and exercise.   Orders: -     Hemoglobin A1c; Future  Other osteoporosis without current pathological fracture  Assessment & Plan: Order dexa. Noticed it was due after patient left office. I will have nurses call.  Orders: -     VITAMIN  D 25 Hydroxy (Vit-D Deficiency, Fractures); Future  Mild intermittent asthma without complication Assessment & Plan: Uses albuterol hfa 2 puffs four times a day as needed       No orders of the defined types were placed in this encounter.   Orders Placed This Encounter  Procedures   CBC with Differential/Platelet   Comprehensive metabolic panel   Hemoglobin A1c   Lipid panel   TSH   VITAMIN D 25 Hydroxy (Vit-D Deficiency, Fractures)     Follow-up: No follow-ups on file.   I,Katherina A Bramblett,acting as a scribe for Blane Ohara, MD.,have documented all relevant documentation on the behalf of Blane Ohara, MD,as directed by  Blane Ohara, MD while in the presence of Blane Ohara, MD.   An After Visit Summary was printed and given to the patient.  Blane Ohara, MD [facility] [phone]

## 2022-06-29 NOTE — Patient Instructions (Signed)
Increase lisinopril to 20 mg daily. Recheck bp and cimp in the office in 2 weeks.

## 2022-07-09 ENCOUNTER — Encounter: Payer: Self-pay | Admitting: Family Medicine

## 2022-07-09 NOTE — Assessment & Plan Note (Signed)
Recommend continue to work on eating healthy diet and exercise.  

## 2022-07-09 NOTE — Assessment & Plan Note (Addendum)
Not at goal Increase lisinopril to 20 mg daily. Recheck bp and cimp in the office in 2 weeks.   Continue Verapamil 120 mg take 1 tablet twice daily. Continue to work on eating a healthy diet and exercise.  Labs reviewed.

## 2022-07-09 NOTE — Assessment & Plan Note (Signed)
Well controlled.  No changes to medicines. Continue atorvastatin 10 mg before bed.  Continue to work on eating a healthy diet and exercise.    

## 2022-07-09 NOTE — Assessment & Plan Note (Signed)
Order dexa. Noticed it was due after patient left office. I will have nurses call.

## 2022-07-09 NOTE — Assessment & Plan Note (Signed)
Uses albuterol hfa 2 puffs four times a day as needed

## 2022-07-10 ENCOUNTER — Telehealth: Payer: Self-pay

## 2022-07-10 NOTE — Telephone Encounter (Addendum)
Called patient left message to call office back.   ----- Message from Blane Ohara, MD sent at 07/09/2022  6:41 PM EDT ----- Regarding: dexa Call and ask if we may schedule her dexa. She is due and I do not believe we discussed this at her appt. Dr. Sedalia Muta

## 2022-07-11 ENCOUNTER — Other Ambulatory Visit: Payer: Self-pay | Admitting: Family Medicine

## 2022-07-13 ENCOUNTER — Ambulatory Visit (INDEPENDENT_AMBULATORY_CARE_PROVIDER_SITE_OTHER): Payer: Medicare Other

## 2022-07-13 VITALS — BP 130/72 | HR 60

## 2022-07-13 DIAGNOSIS — I10 Essential (primary) hypertension: Secondary | ICD-10-CM | POA: Diagnosis not present

## 2022-07-13 NOTE — Progress Notes (Addendum)
   Blood Pressure Recheck Visit  Name: Michelle Whitaker MRN: [id number] Date of Birth: [birth year]  Michelle Whitaker presents today for Blood Pressure recheck with clinical support staff.  Order for BP recheck by Dr Sedalia Muta, ordered on 06/29/22.   BP Readings from Last 3 Encounters:  07/13/22 130/72  06/29/22 (!) 144/72  03/14/22 132/78    Current Outpatient Medications  Medication Sig Dispense Refill   albuterol (VENTOLIN HFA) 108 (90 Base) MCG/ACT inhaler Inhale 2 puffs into the lungs every 6 (six) hours as needed for wheezing or shortness of breath. 8 g 2   aspirin 81 MG tablet Take 81 mg by mouth daily.     atorvastatin (LIPITOR) 10 MG tablet TAKE 1/2 TABLET BY MOUTH EVERY DAY 45 tablet 3   Cholecalciferol (VITAMIN D-3 PO) Take 800 Units by mouth daily.     fluticasone (FLONASE) 50 MCG/ACT nasal spray SPRAY 2 SPRAYS INTO EACH NOSTRIL EVERY DAY 48 mL 1   lisinopril (ZESTRIL) 10 MG tablet TAKE 1 TABLET BY MOUTH EVERY DAY 90 tablet 1   loratadine (CLARITIN) 10 MG tablet Take 10 mg by mouth daily.     Metamucil Fiber CHEW Chew by mouth in the morning and at bedtime.     verapamil (CALAN) 120 MG tablet TAKE 1 TABLET BY MOUTH TWICE A DAY 180 tablet 0   No current facility-administered medications for this visit.    Hypertensive Medication Review: Patient states that they are taking all their hypertensive medications as prescribed and their last dose of hypertensive medications was this morning   Patient states that she was instructed to take a second Lisinopril however it decreased her heart rate to in the 40's so she stopped taking the second one.  Home BP readings given to provider.  Patient does not have any questions or concerns at this time. Patient will contact the office for any future questions or concerns.

## 2022-08-18 ENCOUNTER — Other Ambulatory Visit: Payer: Self-pay | Admitting: Family Medicine

## 2022-08-31 DIAGNOSIS — R195 Other fecal abnormalities: Secondary | ICD-10-CM | POA: Diagnosis not present

## 2022-09-04 ENCOUNTER — Ambulatory Visit: Payer: Medicare Other | Admitting: Family Medicine

## 2022-09-04 ENCOUNTER — Encounter: Payer: Self-pay | Admitting: Family Medicine

## 2022-09-04 VITALS — BP 110/72 | HR 82 | Temp 97.2°F | Resp 14 | Ht 63.5 in | Wt 132.0 lb

## 2022-09-04 DIAGNOSIS — J018 Other acute sinusitis: Secondary | ICD-10-CM | POA: Diagnosis not present

## 2022-09-04 DIAGNOSIS — J019 Acute sinusitis, unspecified: Secondary | ICD-10-CM | POA: Insufficient documentation

## 2022-09-04 DIAGNOSIS — K591 Functional diarrhea: Secondary | ICD-10-CM | POA: Diagnosis not present

## 2022-09-04 MED ORDER — AZITHROMYCIN 250 MG PO TABS
ORAL_TABLET | ORAL | 0 refills | Status: AC
Start: 2022-09-04 — End: 2022-09-09

## 2022-09-04 NOTE — Assessment & Plan Note (Signed)
Z-Pak sent. Encouraged to use normal saline nasal spray and Flonase. If cough continues and patient would like to change her lisinopril to an ARB, she may call us.  This has been an ongoing symptom she has had for years and has been reluctant to change due to previously having side effects from other blood pressure medicines.  She does not recall being on an ARB.

## 2022-09-04 NOTE — Progress Notes (Signed)
Acute Office Visit  Subjective:    Patient ID: Michelle Whitaker, female    DOB: Aug 01, 1945, 77 y.o.   MRN: [id number]  Chief Complaint  Patient presents with   Sinusitis    HPI: Patient is in today for diarrhea which started on Thursday.  She increased her metamucil and has seen improvement.   She also  started with right sided sinus congestion and pressure.  Her right ears feel stopped up.    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    Past Surgical History:  Procedure Laterality Date   BREAST EXCISIONAL BIOPSY     BREAST SURGERY     right breast/benign   CATARACT EXTRACTION, BILATERAL     CERVICAL BIOPSY     benign   COLONOSCOPY     DILATION AND CURETTAGE OF UTERUS     due to heavy bleeding   herniated disc  1991   lower back   POLYPECTOMY      Family History  Problem Relation Age of Onset   Heart disease Mother    Stroke Father    Hypertension Sister    Dementia Sister    Colon cancer Neg Hx    Colon polyps Neg Hx    Esophageal cancer Neg Hx    Rectal cancer Neg Hx    Stomach cancer Neg Hx    Breast cancer Neg Hx     Social History   Socioeconomic History   Marital status: Widowed    Spouse name: Not on file   Number of children: 2   Years of education: Not on file   Highest education level: Not on file  Occupational History   Occupation: Retired Neurosurgeon  Tobacco Use   Smoking status: Never   Smokeless tobacco: Never  Vaping Use   Vaping status: Never Used  Substance and Sexual Activity   Alcohol use: Never   Drug use: No   Sexual activity: Not Currently    Partners: Male  Other Topics Concern   Not on file  Social History Narrative   1  daughter lives in [place],   [place], dtr-in-law, grand-dtr live in [place]   Very active around home   Social Determinants of Health   Financial Resource Strain: Low Risk  (03/14/2022)   Overall Financial Resource Strain (CARDIA)    Difficulty of Paying Living Expenses: Not hard at all  Food Insecurity: No Food Insecurity (03/14/2022)   Hunger Vital Sign    Worried About Running Out of Food in the Last Year: Never true    Ran Out of Food in the Last Year: Never true  Transportation Needs: No Transportation Needs (03/14/2022)   PRAPARE - Administrator, Civil Service (Medical): No    Lack of Transportation (Non-Medical): No  Physical Activity: Insufficiently Active (03/14/2022)   Exercise Vital Sign    Days of Exercise per Week: 6 days    Minutes of Exercise per Session: 20 min  Stress: No Stress Concern Present (03/14/2022)   [employer] of Occupational Health - Occupational Stress  Questionnaire    Feeling of Stress : Not at all  Social Connections: Moderately Integrated (03/14/2022)   Social Connection and Isolation Panel [NHANES]    Frequency of Communication with Friends and Family: More than three times a week    Frequency of Social Gatherings with Friends and Family: Three times a week    Attends Religious Services: More than 4 times per year    Active Member of Clubs or Organizations: Yes    Attends Banker Meetings: More than 4 times per year    Marital Status: Widowed  Intimate Partner Violence: Not on file    Outpatient Medications Prior to Visit  Medication Sig Dispense Refill   albuterol (VENTOLIN HFA) 108 (90 Base) MCG/ACT inhaler Inhale 2 puffs into the lungs every 6 (six) hours as needed for wheezing or shortness of breath. 8 g 2   aspirin 81 MG tablet Take 81 mg by mouth daily.     atorvastatin (LIPITOR) 10 MG tablet TAKE 1/2 TABLET BY MOUTH EVERY DAY 45 tablet 3   Cholecalciferol (VITAMIN D-3 PO) Take 800 Units by mouth daily.      fluticasone (FLONASE) 50 MCG/ACT nasal spray SPRAY 2 SPRAYS INTO EACH NOSTRIL EVERY DAY 48 mL 1   lisinopril (ZESTRIL) 10 MG tablet TAKE 1 TABLET BY MOUTH EVERY DAY 90 tablet 1   loratadine (CLARITIN) 10 MG tablet Take 10 mg by mouth daily.     Metamucil Fiber CHEW Chew by mouth in the morning and at bedtime.     verapamil (CALAN) 120 MG tablet TAKE 1 TABLET BY MOUTH TWICE A DAY 180 tablet 1   No facility-administered medications prior to visit.    Allergies  Allergen Reactions   Atenolol    Codeine    Erythromycin     REACTION: Nausea   Morphine Other (See Comments)    REACTION: nausea/anxiety   Penicillins    Statins     Causes numbness side face    Review of Systems  Constitutional:  Negative for chills and fever.  HENT:  Positive for congestion (right).   Respiratory:  Positive for cough. Negative for shortness of breath.   Cardiovascular:  Positive for chest pain.  Gastrointestinal:  Positive for diarrhea and nausea. Negative for abdominal pain.  Musculoskeletal:  Positive for arthralgias (left shoulder).       Objective:        07/13/2022    8:37 AM 06/29/2022    9:28 AM 06/29/2022    8:46 AM  Vitals with BMI  Height   5' 3.75"  Weight   134 lbs  BMI   23.19  Systolic 130 144 191  Diastolic 72 72 70  Pulse 60  72    No data found.   Physical Exam Vitals reviewed.  Constitutional:      Appearance: Normal appearance.  HENT:     Right Ear: Tympanic membrane, ear canal and external ear normal.     Left Ear: Tympanic membrane, ear canal and external ear normal.     Nose: Congestion (Erythema and edema of right nostril.) present.     Right Sinus: Maxillary sinus tenderness present.     Left Sinus: No maxillary sinus tenderness.     Mouth/Throat:     Pharynx: Posterior oropharyngeal erythema (Mild) present. No oropharyngeal exudate.  Cardiovascular:     Rate and Rhythm: Normal rate and regular rhythm.     Heart sounds: Normal heart sounds. No murmur  heard. Pulmonary:  Effort: Pulmonary effort is normal. No respiratory distress.     Breath sounds: Normal breath sounds.  Lymphadenopathy:     Cervical: No cervical adenopathy.  Neurological:     Mental Status: She is alert and oriented to person, place, and time.  Psychiatric:        Mood and Affect: Mood normal.        Behavior: Behavior normal.     Health Maintenance Due  Topic Date Due   Hepatitis C Screening  Never done   Zoster Vaccines- Shingrix (1 of 2) Never done    There are no preventive care reminders to display for this patient.   Lab Results  Component Value Date   TSH 1.400 09/30/2021   Lab Results  Component Value Date   WBC 3.9 06/26/2022   HGB 14.0 06/26/2022   HCT 41.6 06/26/2022   MCV 88 06/26/2022   PLT 192 06/26/2022   Lab Results  Component Value Date   NA 144 06/26/2022   K 4.2 06/26/2022   CO2 23 06/26/2022   GLUCOSE 101 (H) 06/26/2022   BUN 16 06/26/2022   CREATININE 0.79 06/26/2022   BILITOT 0.5 06/26/2022   ALKPHOS 92 06/26/2022   AST 20 06/26/2022   ALT 17 06/26/2022   PROT 6.6 06/26/2022   ALBUMIN 4.2 06/26/2022   CALCIUM 9.6 06/26/2022   EGFR 77 06/26/2022   Lab Results  Component Value Date   CHOL 171 06/26/2022   Lab Results  Component Value Date   HDL 55 06/26/2022   Lab Results  Component Value Date   LDLCALC 93 06/26/2022   Lab Results  Component Value Date   TRIG 131 06/26/2022   Lab Results  Component Value Date   CHOLHDL 3.1 06/26/2022   Lab Results  Component Value Date   HGBA1C 6.2 (H) 06/26/2022       Assessment & Plan:  Acute non-recurrent sinusitis of other sinus Assessment & Plan: Z-Pak sent. Encouraged to use normal saline nasal spray and Flonase. If cough continues and patient would like to change her lisinopril to an ARB, she may call us.  This has been an ongoing symptom she has had for years and has been reluctant to change due to previously having side effects from other blood  pressure medicines.  She does not recall being on an ARB.  Orders: -     Azithromycin; Take 2 tablets on day 1, then 1 tablet daily on days 2 through 5  Dispense: 6 tablet; Refill: 0  Functional diarrhea Assessment & Plan: Continue Metamucil.      Meds ordered this encounter  Medications   azithromycin (ZITHROMAX) 250 MG tablet    Sig: Take 2 tablets on day 1, then 1 tablet daily on days 2 through 5    Dispense:  6 tablet    Refill:  0    No orders of the defined types were placed in this encounter.    Follow-up: No follow-ups on file.  An After Visit Summary was printed and given to the patient.  Blane Ohara, MD [facility] [phone]

## 2022-09-04 NOTE — Assessment & Plan Note (Signed)
Continue Metamucil

## 2022-10-04 ENCOUNTER — Other Ambulatory Visit: Payer: Self-pay | Admitting: Physician Assistant

## 2022-10-13 ENCOUNTER — Other Ambulatory Visit: Payer: Self-pay | Admitting: Family Medicine

## 2022-11-27 ENCOUNTER — Ambulatory Visit (INDEPENDENT_AMBULATORY_CARE_PROVIDER_SITE_OTHER): Payer: Medicare Other

## 2022-11-27 DIAGNOSIS — Z23 Encounter for immunization: Secondary | ICD-10-CM | POA: Diagnosis not present

## 2022-12-26 ENCOUNTER — Other Ambulatory Visit: Payer: Self-pay

## 2022-12-26 ENCOUNTER — Other Ambulatory Visit: Payer: Medicare Other

## 2022-12-26 DIAGNOSIS — M818 Other osteoporosis without current pathological fracture: Secondary | ICD-10-CM

## 2022-12-26 DIAGNOSIS — R7303 Prediabetes: Secondary | ICD-10-CM

## 2022-12-26 DIAGNOSIS — E782 Mixed hyperlipidemia: Secondary | ICD-10-CM

## 2022-12-27 LAB — COMPREHENSIVE METABOLIC PANEL
ALT: 16 [IU]/L (ref 0–32)
AST: 17 [IU]/L (ref 0–40)
Albumin: 4.3 g/dL (ref 3.8–4.8)
Alkaline Phosphatase: 113 [IU]/L (ref 44–121)
BUN/Creatinine Ratio: 20 (ref 12–28)
BUN: 17 mg/dL (ref 8–27)
Bilirubin Total: 0.6 mg/dL (ref 0.0–1.2)
CO2: 19 mmol/L — ABNORMAL LOW (ref 20–29)
Calcium: 9.5 mg/dL (ref 8.7–10.3)
Chloride: 104 mmol/L (ref 96–106)
Creatinine, Ser: 0.86 mg/dL (ref 0.57–1.00)
Globulin, Total: 2.5 g/dL (ref 1.5–4.5)
Glucose: 99 mg/dL (ref 70–99)
Potassium: 4.5 mmol/L (ref 3.5–5.2)
Sodium: 141 mmol/L (ref 134–144)
Total Protein: 6.8 g/dL (ref 6.0–8.5)
eGFR: 69 mL/min/{1.73_m2} (ref >59)

## 2022-12-27 LAB — CBC WITH DIFFERENTIAL/PLATELET
Basophils Absolute: 0 10*3/uL (ref 0.0–0.2)
Basos: 1 %
EOS (ABSOLUTE): 0.1 10*3/uL (ref 0.0–0.4)
Eos: 3 %
Hematocrit: 43.6 % (ref 34.0–46.6)
Hemoglobin: 14.4 g/dL (ref 11.1–15.9)
Immature Grans (Abs): 0 10*3/uL (ref 0.0–0.1)
Immature Granulocytes: 0 %
Lymphocytes Absolute: 1.6 10*3/uL (ref 0.7–3.1)
Lymphs: 42 %
MCH: 29.9 pg (ref 26.6–33.0)
MCHC: 33 g/dL (ref 31.5–35.7)
MCV: 91 fL (ref 79–97)
Monocytes Absolute: 0.4 10*3/uL (ref 0.1–0.9)
Monocytes: 11 %
Neutrophils Absolute: 1.7 10*3/uL (ref 1.4–7.0)
Neutrophils: 43 %
Platelets: 202 10*3/uL (ref 150–450)
RBC: 4.82 x10E6/uL (ref 3.77–5.28)
RDW: 12.3 % (ref 11.7–15.4)
WBC: 3.9 10*3/uL (ref 3.4–10.8)

## 2022-12-27 LAB — LIPID PANEL
Chol/HDL Ratio: 3.3 ratio (ref 0.0–4.4)
Cholesterol, Total: 191 mg/dL (ref 100–199)
HDL: 58 mg/dL (ref >39)
LDL Chol Calc (NIH): 115 mg/dL — ABNORMAL HIGH (ref 0–99)
Triglycerides: 102 mg/dL (ref 0–149)
VLDL Cholesterol Cal: 18 mg/dL (ref 5–40)

## 2022-12-27 LAB — HEMOGLOBIN A1C
Est. average glucose Bld gHb Est-mCnc: 131 mg/dL
Hgb A1c MFr Bld: 6.2 % — ABNORMAL HIGH (ref 4.8–5.6)

## 2022-12-27 LAB — TSH: TSH: 2.49 u[IU]/mL (ref 0.450–4.500)

## 2022-12-27 LAB — VITAMIN D 25 HYDROXY (VIT D DEFICIENCY, FRACTURES): Vit D, 25-Hydroxy: 37.9 ng/mL (ref 30.0–100.0)

## 2022-12-27 NOTE — Assessment & Plan Note (Signed)
Well controlled.  No changes to medicines. Continue atorvastatin 10 mg before bed.  Continue to work on eating a healthy diet and exercise.

## 2022-12-27 NOTE — Assessment & Plan Note (Signed)
Not at goal Increase lisinopril to 20 mg daily. Recheck bp and cimp in the office in 2 weeks.   Continue Verapamil 120 mg take 1 tablet twice daily. Continue to work on eating a healthy diet and exercise.  Labs reviewed.

## 2022-12-27 NOTE — Assessment & Plan Note (Signed)
Recommend continue to work on eating healthy diet and exercise.  

## 2022-12-27 NOTE — Progress Notes (Signed)
Subjective:  Patient ID: Michelle Whitaker, female    DOB: [birth year]  Age: 77 y.o. MRN: [id number]  Chief Complaint  Patient presents with   Medical Management of Chronic Issues    HPI Hyperlipidemia:  Patient is currently taking Atorvastatin 10 mg taking 1/2 tablet daily.  Eats healthy most of the time. Exercising Walks at [employer]. 2-3 miles a couple of times a week.    Hypertension: Patient is currently taking Lisinopril 10 mg take 1 tablet daily, Verapamil 120 mg take 1 tablet twice daily.   Prediabetes: Last A1c: 6.2%   Asthma, mild, intermittent: continue albuterol HFA 2 puffs four times a day as needed. Rarely needs albuterol hfa.    Allergies: restarted claritin and using flonase. Helping.      12/28/2022    8:35 AM 06/29/2022    8:54 AM 03/14/2022    9:05 AM 09/30/2021    7:42 AM 03/09/2021   10:05 AM  Depression screen PHQ 2/9  Decreased Interest 0 0 0 0 0  Down, Depressed, Hopeless 0 0 0 0 0  PHQ - 2 Score 0 0 0 0 0        12/28/2022    8:34 AM  Fall Risk   Falls in the past year? 0  Number falls in past yr: 0  Injury with Fall? 0  Risk for fall due to : No Fall Risks  Follow up Falls evaluation completed    Patient Care Team: Blane Ohara, MD as PCP - General (Family Medicine) Roxanne Gates, OD (Optometry)   Review of Systems  Constitutional:  Negative for chills, fatigue and fever.  HENT:  Negative for congestion, ear pain, rhinorrhea and sore throat.   Respiratory:  Negative for cough and shortness of breath.   Cardiovascular:  Negative for chest pain.  Gastrointestinal:  Negative for abdominal pain, constipation, diarrhea, nausea and vomiting.  Genitourinary:  Negative for dysuria and urgency.  Musculoskeletal:  Negative for back pain and myalgias.  Neurological:  Negative for dizziness, weakness, light-headedness and headaches.  Psychiatric/Behavioral:  Negative for dysphoric mood. The patient is not nervous/anxious.     Current Outpatient Medications  on File Prior to Visit  Medication Sig Dispense Refill   albuterol (VENTOLIN HFA) 108 (90 Base) MCG/ACT inhaler Inhale 2 puffs into the lungs every 6 (six) hours as needed for wheezing or shortness of breath. 8 g 2   aspirin 81 MG tablet Take 81 mg by mouth daily.     atorvastatin (LIPITOR) 10 MG tablet TAKE 1/2 TABLET BY MOUTH DAILY 45 tablet 1   Cholecalciferol (VITAMIN D-3 PO) Take 800 Units by mouth daily.     fluticasone (FLONASE) 50 MCG/ACT nasal spray SPRAY 2 SPRAYS INTO EACH NOSTRIL EVERY DAY 48 mL 1   loratadine (CLARITIN) 10 MG tablet Take 10 mg by mouth daily.     Metamucil Fiber CHEW Chew by mouth in the morning and at bedtime.     verapamil (CALAN) 120 MG tablet TAKE 1 TABLET BY MOUTH TWICE A DAY 180 tablet 1   No current facility-administered medications on file prior to visit.   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   Past Surgical History:  Procedure Laterality Date   BREAST EXCISIONAL BIOPSY     BREAST SURGERY     right breast/benign   CATARACT EXTRACTION, BILATERAL     CERVICAL BIOPSY     benign   COLONOSCOPY     DILATION AND CURETTAGE OF UTERUS     due to heavy bleeding   herniated disc  1991   lower back   POLYPECTOMY      Family History  Problem Relation Age of Onset   Heart disease Mother    Stroke Father    Hypertension Sister    Dementia Sister    Colon cancer Neg Hx    Colon polyps Neg Hx    Esophageal cancer Neg Hx    Rectal cancer Neg Hx    Stomach cancer Neg Hx    Breast cancer Neg Hx    Social History   Socioeconomic History   Marital status: Widowed    Spouse name: Not on file   Number of children: 2   Years of education: Not on file    Highest education level: Not on file  Occupational History   Occupation: Retired Neurosurgeon  Tobacco Use   Smoking status: Never   Smokeless tobacco: Never  Vaping Use   Vaping status: Never Used  Substance and Sexual Activity   Alcohol use: Never   Drug use: No   Sexual activity: Not Currently    Partners: Male  Other Topics Concern   Not on file  Social History Narrative   1 daughter lives in [place],   [place], dtr-in-law, grand-dtr live in [place]   Very active around home   Social Determinants of Health   Financial Resource Strain: Low Risk  (03/14/2022)   Overall Financial Resource Strain (CARDIA)    Difficulty of Paying Living Expenses: Not hard at all  Food Insecurity: No Food Insecurity (03/14/2022)   Hunger Vital Sign    Worried About Running Out of Food in the Last Year: Never true    Ran Out of Food in the Last Year: Never true  Transportation Needs: No Transportation Needs (03/14/2022)   PRAPARE - Administrator, Civil Service (Medical): No    Lack of Transportation (Non-Medical): No  Physical Activity: Insufficiently Active (03/14/2022)   Exercise Vital Sign    Days of Exercise per Week: 6 days    Minutes of Exercise per Session: 20 min  Stress: No Stress Concern Present (03/14/2022)   [employer] of Occupational Health - Occupational Stress Questionnaire    Feeling of Stress : Not at all  Social Connections: Moderately Integrated (03/14/2022)   Social Connection and Isolation Panel [NHANES]    Frequency of Communication with Friends and Family: More than three times a week    Frequency of Social Gatherings with Friends and Family: Three times a week    Attends Religious Services: More than 4 times per year    Active Member of Clubs or Organizations: Yes    Attends Banker Meetings: More than 4 times per year    Marital Status: Widowed    Objective:  BP 138/72   Pulse 63   Temp 97.6 F (36.4 C)   Ht 5' 3.75" (1.619 m)    Wt 138 lb (62.6 kg)   SpO2 97%   BMI 23.87 kg/m      12/28/2022  8:29 AM 09/04/2022   10:41 AM 07/13/2022    8:37 AM  BP/Weight  Systolic BP 138 110 130  Diastolic BP 72 72 72  Wt. (Lbs) 138 132   BMI 23.87 kg/m2 23.02 kg/m2     Physical Exam Vitals reviewed.  Constitutional:      Appearance: Normal appearance. She is normal weight.  Neck:     Vascular: No carotid bruit.  Cardiovascular:     Rate and Rhythm: Normal rate and regular rhythm.     Heart sounds: Normal heart sounds.  Pulmonary:     Effort: Pulmonary effort is normal. No respiratory distress.     Breath sounds: Normal breath sounds.  Abdominal:     General: Abdomen is flat. Bowel sounds are normal.     Palpations: Abdomen is soft.     Tenderness: There is no abdominal tenderness.  Neurological:     Mental Status: She is alert and oriented to person, place, and time.  Psychiatric:        Mood and Affect: Mood normal.        Behavior: Behavior normal.     Diabetic Foot Exam - Simple   No data filed      Lab Results  Component Value Date   WBC 3.9 12/26/2022   HGB 14.4 12/26/2022   HCT 43.6 12/26/2022   PLT 202 12/26/2022   GLUCOSE 99 12/26/2022   CHOL 191 12/26/2022   TRIG 102 12/26/2022   HDL 58 12/26/2022   LDLCALC 115 (H) 12/26/2022   ALT 16 12/26/2022   AST 17 12/26/2022   NA 141 12/26/2022   K 4.5 12/26/2022   CL 104 12/26/2022   CREATININE 0.86 12/26/2022   BUN 17 12/26/2022   CO2 19 (L) 12/26/2022   TSH 2.490 12/26/2022   HGBA1C 6.2 (H) 12/26/2022      Assessment & Plan:    Essential hypertension, benign Assessment & Plan: Not at goal Increase lisinopril to 20 mg daily. Recheck bp and cimp in the office in 2 weeks.   Continue Verapamil 120 mg take 1 tablet twice daily. Continue to work on eating a healthy diet and exercise.  Labs reviewed.   Orders: -     Lisinopril; Take 0.5 tablets (10 mg total) by mouth daily.  Dispense: 90 tablet; Refill:  0  Prediabetes Assessment & Plan: Recommend continue to work on eating healthy diet and exercise.    Mixed hyperlipidemia Assessment & Plan: Well controlled.  No changes to medicines. Continue atorvastatin 10 mg before bed.  Continue to work on eating a healthy diet and exercise.      Other osteoporosis without current pathological fracture  Assessment & Plan: Continue calcium with d.       Meds ordered this encounter  Medications   lisinopril (ZESTRIL) 20 MG tablet    Sig: Take 0.5 tablets (10 mg total) by mouth daily.    Dispense:  90 tablet    Refill:  0    No orders of the defined types were placed in this encounter.    Follow-up: Return in about 6 months (around 06/27/2023) for chronic follow up.   I,Marla I Leal-Borjas,acting as a scribe for Blane Ohara, MD.,have documented all relevant documentation on the behalf of Blane Ohara, MD,as directed by  Blane Ohara, MD while in the presence of Blane Ohara, MD.   An After Visit Summary was printed and given to the patient.  Blane Ohara, MD [facility] [phone]

## 2022-12-28 ENCOUNTER — Encounter: Payer: Self-pay | Admitting: Family Medicine

## 2022-12-28 ENCOUNTER — Other Ambulatory Visit: Payer: Self-pay | Admitting: Family Medicine

## 2022-12-28 ENCOUNTER — Ambulatory Visit (INDEPENDENT_AMBULATORY_CARE_PROVIDER_SITE_OTHER): Payer: Medicare Other | Admitting: Family Medicine

## 2022-12-28 VITALS — BP 138/72 | HR 63 | Temp 97.6°F | Ht 63.75 in | Wt 138.0 lb

## 2022-12-28 DIAGNOSIS — E782 Mixed hyperlipidemia: Secondary | ICD-10-CM | POA: Diagnosis not present

## 2022-12-28 DIAGNOSIS — R7303 Prediabetes: Secondary | ICD-10-CM | POA: Diagnosis not present

## 2022-12-28 DIAGNOSIS — M818 Other osteoporosis without current pathological fracture: Secondary | ICD-10-CM | POA: Diagnosis not present

## 2022-12-28 DIAGNOSIS — I10 Essential (primary) hypertension: Secondary | ICD-10-CM | POA: Diagnosis not present

## 2022-12-30 MED ORDER — LISINOPRIL 20 MG PO TABS: 10.0000 mg | ORAL_TABLET | Freq: Every day | ORAL | 0 refills | Status: DC

## 2022-12-30 NOTE — Assessment & Plan Note (Signed)
Continue calcium with d.

## 2023-01-12 DIAGNOSIS — H26493 Other secondary cataract, bilateral: Secondary | ICD-10-CM | POA: Diagnosis not present

## 2023-01-12 DIAGNOSIS — H04123 Dry eye syndrome of bilateral lacrimal glands: Secondary | ICD-10-CM | POA: Diagnosis not present

## 2023-01-23 ENCOUNTER — Other Ambulatory Visit: Payer: Self-pay | Admitting: Family Medicine

## 2023-02-09 ENCOUNTER — Other Ambulatory Visit: Payer: Self-pay | Admitting: Family Medicine

## 2023-03-20 ENCOUNTER — Ambulatory Visit: Payer: Medicare Other

## 2023-03-20 DIAGNOSIS — Z1231 Encounter for screening mammogram for malignant neoplasm of breast: Secondary | ICD-10-CM

## 2023-03-20 DIAGNOSIS — Z0000 Encounter for general adult medical examination without abnormal findings: Secondary | ICD-10-CM

## 2023-03-20 NOTE — Progress Notes (Signed)
Subjective:   Michelle Whitaker is a 78 y.o. female who presents for Medicare Annual (Subsequent) preventive examination.  This wellness visit is conducted by a nurse.  The patient's medications were reviewed and reconciled since the patient's last visit.  History details were provided by the patient.  The history appears to be reliable.    Medical History: Patient history and Family history was reviewed  Medications, Allergies, and preventative health maintenance was reviewed and updated.   Visit Complete: Virtual I connected with  Michelle Whitaker on 03/20/23 by a audio enabled telemedicine application and verified that I am speaking with the correct person using two identifiers.  Patient Location: Home  Provider Location: Office/Clinic  I discussed the limitations of evaluation and management by telemedicine. The patient expressed understanding and agreed to proceed.  Vital Signs: Because this visit was a virtual/telehealth visit, some criteria may be missing or patient reported. Any vitals not documented were not able to be obtained and vitals that have been documented are patient reported.  Cardiac Risk Factors include: advanced age (>41men, >48 women)     Objective:   Patient was unable to self-report due to a lack of equipment at home via telehealth  There were no vitals filed for this visit. There is no height or weight on file to calculate BMI.     03/09/2021   10:08 AM 03/02/2020    3:59 PM  Advanced Directives  Does Patient Have a Medical Advance Directive? Yes Yes  Type of Estate agent of [place];Living will Healthcare Power of Attorney  Does patient want to make changes to medical advance directive? No - Patient declined   Copy of Healthcare Power of Attorney in Chart? No - copy requested No - copy requested    Current Medications (verified) Outpatient Encounter Medications as of 03/20/2023  Medication Sig   albuterol (VENTOLIN HFA) 108 (90 Base)  MCG/ACT inhaler Inhale 2 puffs into the lungs every 6 (six) hours as needed for wheezing or shortness of breath.   aspirin 81 MG tablet Take 81 mg by mouth daily.   atorvastatin (LIPITOR) 10 MG tablet TAKE 1/2 TABLET BY MOUTH DAILY   Cholecalciferol (VITAMIN D-3 PO) Take 800 Units by mouth daily.   fluticasone (FLONASE) 50 MCG/ACT nasal spray SPRAY 2 SPRAYS INTO EACH NOSTRIL EVERY DAY   lisinopril (ZESTRIL) 10 MG tablet Take 10 mg by mouth daily.   lisinopril (ZESTRIL) 20 MG tablet Take 0.5 tablets (10 mg total) by mouth daily.   loratadine (CLARITIN) 10 MG tablet Take 10 mg by mouth daily.   Metamucil Fiber CHEW Chew by mouth in the morning and at bedtime.   verapamil (CALAN) 120 MG tablet TAKE 1 TABLET BY MOUTH TWICE A DAY   No facility-administered encounter medications on file as of 03/20/2023.    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   Past Surgical History:  Procedure Laterality Date   BREAST EXCISIONAL BIOPSY     BREAST SURGERY  right breast/benign   CATARACT EXTRACTION, BILATERAL     CERVICAL BIOPSY     benign   COLONOSCOPY     DILATION AND CURETTAGE OF UTERUS     due to heavy bleeding   herniated disc  1991   lower back   POLYPECTOMY     Family History  Problem Relation Age of Onset   Heart disease Mother    Stroke Father    Hypertension Sister    Dementia Sister    Colon cancer Neg Hx    Colon polyps Neg Hx    Esophageal cancer Neg Hx    Rectal cancer Neg Hx    Stomach cancer Neg Hx    Breast cancer Neg Hx    Social History   Socioeconomic History    Marital status: Widowed    Spouse name: Not on file   Number of children: 2   Years of education: Not on file   Highest education level: Not on file  Occupational History   Occupation: Retired Neurosurgeon  Tobacco Use   Smoking status: Never   Smokeless tobacco: Never  Vaping Use   Vaping status: Never Used  Substance and Sexual Activity   Alcohol use: Never   Drug use: No   Sexual activity: Not Currently    Partners: Male  Other Topics Concern   Not on file  Social History Narrative   1 daughter lives in [place],   [place], dtr-in-law, grand-dtr live in [place]   Very active around home   Social Drivers of Health   Financial Resource Strain: Low Risk  (03/20/2023)   Overall Financial Resource Strain (CARDIA)    Difficulty of Paying Living Expenses: Not hard at all  Food Insecurity: No Food Insecurity (03/20/2023)   Hunger Vital Sign    Worried About Running Out of Food in the Last Year: Never true    Ran Out of Food in the Last Year: Never true  Transportation Needs: No Transportation Needs (03/20/2023)   PRAPARE - Administrator, Civil Service (Medical): No    Lack of Transportation (Non-Medical): No  Physical Activity: Insufficiently Active (03/20/2023)   Exercise Vital Sign    Days of Exercise per Week: 6 days    Minutes of Exercise per Session: 20 min  Stress: No Stress Concern Present (03/20/2023)   [employer] of Occupational Health - Occupational Stress Questionnaire    Feeling of Stress : Not at all  Social Connections: Moderately Integrated (03/20/2023)   Social Connection and Isolation Panel [NHANES]    Frequency of Communication with Friends and Family: More than three times a week    Frequency of Social Gatherings with Friends and Family: Three times a week    Attends Religious Services: More than 4 times per year    Active Member of Clubs or Organizations: Yes    Attends Banker Meetings: More than 4 times per year    Marital  Status: Widowed    Tobacco Counseling Counseling given: Not Answered   Clinical Intake:  Pre-visit preparation completed: Yes  Pain : No/denies pain     BMI - recorded: 23.88 Nutritional Status: BMI of 19-24  Normal Nutritional Risks: None Diabetes: No  How often do you need to have someone help you when you read instructions, pamphlets, or other written materials from your doctor or pharmacy?: 1 - Never  Interpreter Needed?: No      Activities of Daily Living    03/20/2023  3:11 PM  In your present state of health, do you have any difficulty performing the following activities:  Hearing? 0  Vision? 0  Difficulty concentrating or making decisions? 0  Walking or climbing stairs? 0  Dressing or bathing? 0  Doing errands, shopping? 0  Preparing Food and eating ? N  Using the Toilet? N  In the past six months, have you accidently leaked urine? N  Do you have problems with loss of bowel control? N  Managing your Medications? N  Managing your Finances? N  Housekeeping or managing your Housekeeping? N    Patient Care Team: CoxFritzi Mandes, MD as PCP - General (Family Medicine) Roxanne Gates, OD (Optometry)  Indicate any recent Medical Services you may have received from other than [facility] providers in the past year (date may be approximate).     Assessment:   This is a routine wellness examination for Michelle Whitaker.  Hearing/Vision screen No results found.   Goals Addressed   None    Depression Screen    03/20/2023    3:08 PM 12/28/2022    8:35 AM 06/29/2022    8:54 AM 03/14/2022    9:05 AM 09/30/2021    7:42 AM 03/09/2021   10:05 AM 09/08/2020    9:23 AM  PHQ 2/9 Scores  PHQ - 2 Score 0 0 0 0 0 0 0    Fall Risk    03/20/2023    3:02 PM 12/28/2022    8:34 AM 06/29/2022    8:54 AM 03/14/2022    9:09 AM 09/30/2021    7:42 AM  Fall Risk   Falls in the past year? 0 0 0 0 0  Number falls in past yr: 0 0 0 0 0  Injury with Fall? 0 0 0 0 0  Risk for fall due to : No Fall  Risks No Fall Risks No Fall Risks No Fall Risks No Fall Risks  Follow up Falls evaluation completed;Education provided Falls evaluation completed Falls evaluation completed;Falls prevention discussed Falls evaluation completed;Education provided Falls evaluation completed    MEDICARE RISK AT HOME: Medicare Risk at Home Any stairs in or around the home?: Yes If so, are there any without handrails?: No Home free of loose throw rugs in walkways, pet beds, electrical cords, etc?: Yes Adequate lighting in your home to reduce risk of falls?: Yes Life alert?: No Use of a cane, walker or w/c?: No Grab bars in the bathroom?: Yes Shower chair or bench in shower?: No Elevated toilet seat or a handicapped toilet?: No  TIMED UP AND GO:  Was the test performed?  No    Cognitive Function:        03/20/2023    3:11 PM 03/14/2022    9:10 AM 03/09/2021   10:10 AM  6CIT Screen  What Year? 0 points 0 points 0 points  What month? 0 points 0 points 0 points  What time? 0 points 0 points 0 points  Count back from 20 0 points 0 points 0 points  Months in reverse 0 points 0 points 0 points  Repeat phrase 0 points 0 points 0 points  Total Score 0 points 0 points 0 points    Immunizations Immunization History  Administered Date(s) Administered   Fluad Quad(high Dose 65+) 11/03/2019, 11/16/2020, 12/22/2021   Fluad Trivalent(High Dose 65+) 11/27/2022   Moderna Sars-Covid-2 Vaccination 03/28/2019, 04/23/2019, 12/19/2019   Pneumococcal Conjugate-13 01/24/2014   Pneumococcal Polysaccharide-23 12/02/2010   Tdap 02/21/2015    TDAP status: Up  to date  Flu Vaccine status: Up to date  Pneumococcal vaccine status: Up to date  Covid-19 vaccine status: Declined, Education has been provided regarding the importance of this vaccine but patient still declined. Advised may receive this vaccine at local pharmacy or Health Dept.or vaccine clinic. Aware to provide a copy of the vaccination record if obtained  from local pharmacy or Health Dept. Verbalized acceptance and understanding.  Qualifies for Shingles Vaccine? Yes   Zostavax completed No   Shingrix Completed?: No.    Education has been provided regarding the importance of this vaccine. Patient has been advised to call insurance company to determine out of pocket expense if they have not yet received this vaccine. Advised may also receive vaccine at local pharmacy or Health Dept. Verbalized acceptance and understanding.  Screening Tests Health Maintenance  Topic Date Due   Hepatitis C Screening  Never done   Medicare Annual Wellness (AWV)  03/15/2023   DTaP/Tdap/Td (2 - Td or Tdap) 02/20/2025   Pneumonia Vaccine 7+ Years old  Completed   INFLUENZA VACCINE  Completed   DEXA SCAN  Completed   HPV VACCINES  Aged Out   Colonoscopy  Discontinued   COVID-19 Vaccine  Discontinued   Zoster Vaccines- Shingrix  Discontinued    Health Maintenance  Health Maintenance Due  Topic Date Due   Hepatitis C Screening  Never done   Medicare Annual Wellness (AWV)  03/15/2023    Colorectal cancer screening: No longer required.   Mammogram status: Completed 03/2022. Repeat every year  Lung Cancer Screening: (Low Dose CT Chest recommended if Age 62-80 years, 20 pack-year currently smoking OR have quit w/in 15years.) does not qualify.   Additional Screening:  Vision Screening: Recommended annual ophthalmology exams for early detection of glaucoma and other disorders of the eye. Is the patient up to date with their annual eye exam?  Yes  Who is the provider or what is the name of the office in which the patient attends annual eye exams? [facility]  Dental Screening: Recommended annual dental exams for proper oral hygiene  Community Resource Referral / Chronic Care Management: CRR required this visit?  No   CCM required this visit?  No     Plan:    1- Mammogram scheduled 04/11/23 2- Patient declined Shingrix vaccine and COVID vaccine  I  have personally reviewed and noted the following in the patient's chart:   Medical and social history Use of alcohol, tobacco or illicit drugs  Current medications and supplements including opioid prescriptions. Patient is not currently taking opioid prescriptions. Functional ability and status Nutritional status Physical activity Advanced directives List of other physicians Hospitalizations, surgeries, and ER visits in previous 12 months Vitals Screenings to include cognitive, depression, and falls Referrals and appointments  In addition, I have reviewed and discussed with patient certain preventive protocols, quality metrics, and best practice recommendations. A written personalized care plan for preventive services as well as general preventive health recommendations were provided to patient.     Jacklynn Bue, LPN   1/61/0960   After Visit Summary: (MyChart) Due to this being a telephonic visit, the after visit summary with patients personalized plan was offered to patient via MyChart

## 2023-03-20 NOTE — Patient Instructions (Signed)
[name] , Thank you for taking time to come for your Medicare Wellness Visit. I appreciate your ongoing commitment to your health goals. Please review the following plan we discussed and let me know if I can assist you in the future.    This is a list of the screening recommended for you and due dates:  Health Maintenance  Topic Date Due   Medicare Annual Wellness Visit  03/19/2024   DTaP/Tdap/Td vaccine (2 - Td or Tdap) 02/20/2025   Pneumonia Vaccine  Completed   Flu Shot  Completed   DEXA scan (bone density measurement)  Completed   HPV Vaccine  Aged Out   Colon Cancer Screening  Discontinued   COVID-19 Vaccine  Discontinued   Zoster (Shingles) Vaccine  Discontinued    Preventive Care 65 Years and Older, Female Preventive care refers to lifestyle choices and visits with your health care provider that can promote health and wellness. What does preventive care include? A yearly physical exam. This is also called an annual well check. Dental exams once or twice a year. Routine eye exams. Ask your health care provider how often you should have your eyes checked. Personal lifestyle choices, including: Daily care of your teeth and gums. Regular physical activity. Eating a healthy diet. Avoiding tobacco and drug use. Limiting alcohol use. Practicing safe sex. Taking low-dose aspirin every day. Taking vitamin and mineral supplements as recommended by your health care provider. What happens during an annual well check? The services and screenings done by your health care provider during your annual well check will depend on your age, overall health, lifestyle risk factors, and family history of disease. Counseling  Your health care provider may ask you questions about your: Alcohol use. Tobacco use. Drug use. Emotional well-being. Home and relationship well-being. Sexual activity. Eating habits. History of falls. Memory and ability to understand (cognition). Work and work  Astronomer. Reproductive health. Screening  You may have the following tests or measurements: Height, weight, and BMI. Blood pressure. Lipid and cholesterol levels. These may be checked every 5 years, or more frequently if you are over 55 years old. Skin check. Lung cancer screening. You may have this screening every year starting at age 49 if you have a 30-pack-year history of smoking and currently smoke or have quit within the past 15 years. Fecal occult blood test (FOBT) of the stool. You may have this test every year starting at age 42. Flexible sigmoidoscopy or colonoscopy. You may have a sigmoidoscopy every 5 years or a colonoscopy every 10 years starting at age 73. Hepatitis C blood test. Hepatitis B blood test. Sexually transmitted disease (STD) testing. Diabetes screening. This is done by checking your blood sugar (glucose) after you have not eaten for a while (fasting). You may have this done every 1-3 years. Bone density scan. This is done to screen for osteoporosis. You may have this done starting at age 50. Mammogram. This may be done every 1-2 years. Talk to your health care provider about how often you should have regular mammograms. Talk with your health care provider about your test results, treatment options, and if necessary, the need for more tests. Vaccines  Your health care provider may recommend certain vaccines, such as: Influenza vaccine. This is recommended every year. Tetanus, diphtheria, and acellular pertussis (Tdap, Td) vaccine. You may need a Td booster every 10 years. Zoster vaccine. You may need this after age 81. Pneumococcal 13-valent conjugate (PCV13) vaccine. One dose is recommended after age 50.  Pneumococcal polysaccharide (PPSV23) vaccine. One dose is recommended after age 23. Talk to your health care provider about which screenings and vaccines you need and how often you need them. This information is not intended to replace advice given to you by  your health care provider. Make sure you discuss any questions you have with your health care provider. Document Released: 03/05/2015 Document Revised: 10/27/2015 Document Reviewed: 12/08/2014 Elsevier Interactive Patient Education  2017 [employer].  Fall Prevention in the Home Falls can cause injuries. They can happen to people of all ages. There are many things you can do to make your home safe and to help prevent falls. What can I do on the outside of my home? Regularly fix the edges of walkways and driveways and fix any cracks. Remove anything that might make you trip as you walk through a door, such as a raised step or threshold. Trim any bushes or trees on the path to your home. Use bright outdoor lighting. Clear any walking paths of anything that might make someone trip, such as rocks or tools. Regularly check to see if handrails are loose or broken. Make sure that both sides of any steps have handrails. Any raised decks and porches should have guardrails on the edges. Have any leaves, snow, or ice cleared regularly. Use sand or salt on walking paths during winter. Clean up any spills in your garage right away. This includes oil or grease spills. What can I do in the bathroom? Use night lights. Install grab bars by the toilet and in the tub and shower. Do not use towel bars as grab bars. Use non-skid mats or decals in the tub or shower. If you need to sit down in the shower, use a plastic, non-slip stool. Keep the floor dry. Clean up any water that spills on the floor as soon as it happens. Remove soap buildup in the tub or shower regularly. Attach bath mats securely with double-sided non-slip rug tape. Do not have throw rugs and other things on the floor that can make you trip. What can I do in the bedroom? Use night lights. Make sure that you have a light by your bed that is easy to reach. Do not use any sheets or blankets that are too big for your bed. They should not hang  down onto the floor. Have a firm chair that has side arms. You can use this for support while you get dressed. Do not have throw rugs and other things on the floor that can make you trip. What can I do in the kitchen? Clean up any spills right away. Avoid walking on wet floors. Keep items that you use a lot in easy-to-reach places. If you need to reach something above you, use a strong step stool that has a grab bar. Keep electrical cords out of the way. Do not use floor polish or wax that makes floors slippery. If you must use wax, use non-skid floor wax. Do not have throw rugs and other things on the floor that can make you trip. What can I do with my stairs? Do not leave any items on the stairs. Make sure that there are handrails on both sides of the stairs and use them. Fix handrails that are broken or loose. Make sure that handrails are as long as the stairways. Check any carpeting to make sure that it is firmly attached to the stairs. Fix any carpet that is loose or worn. Avoid having throw rugs at the  top or bottom of the stairs. If you do have throw rugs, attach them to the floor with carpet tape. Make sure that you have a light switch at the top of the stairs and the bottom of the stairs. If you do not have them, ask someone to add them for you. What else can I do to help prevent falls? Wear shoes that: Do not have high heels. Have rubber bottoms. Are comfortable and fit you well. Are closed at the toe. Do not wear sandals. If you use a stepladder: Make sure that it is fully opened. Do not climb a closed stepladder. Make sure that both sides of the stepladder are locked into place. Ask someone to hold it for you, if possible. Clearly mark and make sure that you can see: Any grab bars or handrails. First and last steps. Where the edge of each step is. Use tools that help you move around (mobility aids) if they are needed. These  include: Canes. Walkers. Scooters. Crutches. Turn on the lights when you go into a dark area. Replace any light bulbs as soon as they burn out. Set up your furniture so you have a clear path. Avoid moving your furniture around. If any of your floors are uneven, fix them. If there are any pets around you, be aware of where they are. Review your medicines with your doctor. Some medicines can make you feel dizzy. This can increase your chance of falling. Ask your doctor what other things that you can do to help prevent falls. This information is not intended to replace advice given to you by your health care provider. Make sure you discuss any questions you have with your health care provider. Document Released: 12/03/2008 Document Revised: 07/15/2015 Document Reviewed: 03/13/2014 Elsevier Interactive Patient Education  2017 [employer].

## 2023-03-21 ENCOUNTER — Telehealth: Payer: Self-pay

## 2023-03-21 NOTE — Telephone Encounter (Signed)
Copied from CRM [id number]. Topic: Appointments - Scheduling Inquiry for Clinic >> Mar 21, 2023 10:24 AM Thomes Dinning wrote: Reason for CRM: Patient is needing a TB shot and would like to get scheduled. Please give the patient a call back at [phone]

## 2023-03-21 NOTE — Telephone Encounter (Signed)
I left a message on both numbers in the chart asking the patient to call the office back to see about getting an appointment scheduled. Waiting for her to call back.  The TB skin test appointment can be a nurse visit and it can be scheduled any time during office ours on Mondays and Tuesdays. Also it can be scheduled on Wednesday mornings. Do not schedule this appointment type on Wednesday afternoons or at any time on Thursdays or Fridays.

## 2023-03-22 ENCOUNTER — Ambulatory Visit: Payer: Medicare Other

## 2023-03-23 ENCOUNTER — Ambulatory Visit: Payer: Medicare Other

## 2023-03-26 ENCOUNTER — Ambulatory Visit (INDEPENDENT_AMBULATORY_CARE_PROVIDER_SITE_OTHER): Payer: Medicare Other

## 2023-03-26 DIAGNOSIS — Z111 Encounter for screening for respiratory tuberculosis: Secondary | ICD-10-CM | POA: Diagnosis not present

## 2023-03-27 ENCOUNTER — Other Ambulatory Visit: Payer: Self-pay | Admitting: Family Medicine

## 2023-03-28 ENCOUNTER — Ambulatory Visit (INDEPENDENT_AMBULATORY_CARE_PROVIDER_SITE_OTHER): Payer: Medicare Other

## 2023-03-28 DIAGNOSIS — Z111 Encounter for screening for respiratory tuberculosis: Secondary | ICD-10-CM | POA: Diagnosis not present

## 2023-03-28 DIAGNOSIS — H26491 Other secondary cataract, right eye: Secondary | ICD-10-CM | POA: Diagnosis not present

## 2023-03-28 LAB — TB SKIN TEST: TB Skin Test: NEGATIVE

## 2023-03-28 NOTE — Progress Notes (Signed)
 Patient is in office today for a nurse visit for PPD. Patient Had no redness or any raised area. Patient was given copy of results form.

## 2023-04-01 NOTE — Addendum Note (Signed)
 Addended byMercy Stall on: 04/01/2023 03:17 PM   Modules accepted: Level of Service

## 2023-04-13 NOTE — Telephone Encounter (Unsigned)
 Copied from CRM [id number]. Topic: Clinical - Medical Advice >> Apr 13, 2023  8:04 AM Gildardo Pounds wrote: Reason for CRM: Patient has upper respiratory issue, stuffy head causing pain in head, dry cough started in the last few days. had it last year and cleared it up and z pack cleared it up. Callback number is [phone] home or cell [phone]

## 2023-04-15 DIAGNOSIS — J3489 Other specified disorders of nose and nasal sinuses: Secondary | ICD-10-CM | POA: Diagnosis not present

## 2023-04-15 DIAGNOSIS — R051 Acute cough: Secondary | ICD-10-CM | POA: Diagnosis not present

## 2023-04-15 DIAGNOSIS — J018 Other acute sinusitis: Secondary | ICD-10-CM | POA: Diagnosis not present

## 2023-05-15 ENCOUNTER — Ambulatory Visit
Admission: RE | Admit: 2023-05-15 | Discharge: 2023-05-15 | Disposition: A | Discharge status: Home / Self Care | Payer: Medicare Other | Source: Ambulatory Visit | Attending: Family Medicine | Admitting: Family Medicine

## 2023-05-15 DIAGNOSIS — Z1231 Encounter for screening mammogram for malignant neoplasm of breast: Secondary | ICD-10-CM

## 2023-05-17 ENCOUNTER — Encounter: Payer: Self-pay | Admitting: Family Medicine

## 2023-05-22 ENCOUNTER — Ambulatory Visit (INDEPENDENT_AMBULATORY_CARE_PROVIDER_SITE_OTHER): Admitting: Physician Assistant

## 2023-05-22 ENCOUNTER — Encounter: Payer: Self-pay | Admitting: Physician Assistant

## 2023-05-22 VITALS — BP 144/72 | HR 88 | Temp 98.2°F | Resp 16 | Ht 63.75 in | Wt 141.8 lb

## 2023-05-22 DIAGNOSIS — J069 Acute upper respiratory infection, unspecified: Secondary | ICD-10-CM | POA: Diagnosis not present

## 2023-05-22 LAB — POCT FLU A/B STATUS
Influenza A, POC: NEGATIVE
Influenza B, POC: NEGATIVE

## 2023-05-22 LAB — POC COVID19 BINAXNOW: SARS Coronavirus 2 Ag: NEGATIVE

## 2023-05-22 MED ORDER — AZITHROMYCIN 250 MG PO TABS: ORAL_TABLET | ORAL | 0 refills | Status: AC

## 2023-05-22 MED ORDER — MONTELUKAST SODIUM 10 MG PO TABS
10.0000 mg | ORAL_TABLET | Freq: Every day | ORAL | 3 refills | Status: DC
Start: 2023-05-22 — End: 2023-06-28

## 2023-05-22 NOTE — Progress Notes (Signed)
 Acute Office Visit  Subjective:    Patient ID: Michelle Whitaker, female    DOB: 01-13-1946, 78 y.o.   MRN: [id number]  Chief Complaint  Patient presents with   Cough   Nasal Congestion    HPI: Patient is in today for complaints of cough, cold and congestion - started with productive cough and sinus congestion after working out in yard - now has productive phlegm and chills - denies nausea, vomiting Is taking claritin and flonase - has not had to use albuterol yet   Current Outpatient Medications:    albuterol (VENTOLIN HFA) 108 (90 Base) MCG/ACT inhaler, Inhale 2 puffs into the lungs every 6 (six) hours as needed for wheezing or shortness of breath., Disp: 8 g, Rfl: 2   aspirin 81 MG tablet, Take 81 mg by mouth daily., Disp: , Rfl:    atorvastatin (LIPITOR) 10 MG tablet, TAKE 1/2 TABLET BY MOUTH DAILY, Disp: 45 tablet, Rfl: 1   Cholecalciferol (VITAMIN D-3 PO), Take 800 Units by mouth daily., Disp: , Rfl:    fluticasone (FLONASE) 50 MCG/ACT nasal spray, SPRAY 2 SPRAYS INTO EACH NOSTRIL EVERY DAY, Disp: 48 mL, Rfl: 1   lisinopril (ZESTRIL) 10 MG tablet, Take 10 mg by mouth daily., Disp: , Rfl:    loratadine (CLARITIN) 10 MG tablet, Take 10 mg by mouth daily., Disp: , Rfl:    Metamucil Fiber CHEW, Chew by mouth in the morning and at bedtime., Disp: , Rfl:    verapamil (CALAN) 120 MG tablet, TAKE 1 TABLET BY MOUTH TWICE A DAY, Disp: 180 tablet, Rfl: 1   azithromycin (ZITHROMAX) 250 MG tablet, Take 2 tablets on day 1, then 1 tablet daily on days 2 through 5, Disp: 6 tablet, Rfl: 0   montelukast (SINGULAIR) 10 MG tablet, Take 1 tablet (10 mg total) by mouth at bedtime., Disp: 30 tablet, Rfl: 3  Allergies  Allergen Reactions   Atenolol    Codeine    Erythromycin     REACTION: Nausea   Morphine Other (See Comments)    REACTION: nausea/anxiety   Penicillins    Statins     Causes numbness side face    ROS CONSTITUTIONAL: see HPI  E/N/T: see HPI  CARDIOVASCULAR: Negative for chest  pain,  RESPIRATORY:see HPI GASTROINTESTINAL: Negative for abdominal pain, acid reflux symptoms, constipation, diarrhea, nausea and vomiting.       Objective:    PHYSICAL EXAM:   BP (!) 144/72   Pulse 88   Temp 98.2 F (36.8 C) (Temporal)   Resp 16   Ht 5' 3.75" (1.619 m)   Wt 141 lb 12.8 oz (64.3 kg)   SpO2 96%   BMI 24.53 kg/m    GEN: Well nourished, well developed, in no acute distress  HEENT: normal external ears and nose - normal external auditory canals and TMS - hearing grossly normal  - Lips, Teeth and Gums - normal  Oropharynx - erythema/pnd  Cardiac: RRR; no murmurs, Respiratory: faint rhonchi -- clears with cough- no wheezes  Psych: euthymic mood, appropriate affect and demeanor Office Visit on 05/22/2023  Component Date Value Ref Range Status   SARS Coronavirus 2 Ag 05/22/2023 Negative  Negative Final   Influenza A, POC 05/22/2023 Negative  Negative Final   Influenza B, POC 05/22/2023 Negative  Negative Final       Assessment & Plan:    Acute upper respiratory infection -     POC COVID-19 BinaxNow -     POCT Flu  A & B Status -     Azithromycin; Take 2 tablets on day 1, then 1 tablet daily on days 2 through 5  Dispense: 6 tablet; Refill: 0 -     Montelukast Sodium; Take 1 tablet (10 mg total) by mouth at bedtime.  Dispense: 30 tablet; Refill: 3   Continue claritin and flonase Follow-up: Return if symptoms worsen or fail to improve.  An After Visit Summary was printed and given to the patient.  Jettie Pagan [facility] [phone]

## 2023-06-26 ENCOUNTER — Other Ambulatory Visit: Payer: Medicare Other

## 2023-06-26 DIAGNOSIS — R7303 Prediabetes: Secondary | ICD-10-CM

## 2023-06-26 DIAGNOSIS — E782 Mixed hyperlipidemia: Secondary | ICD-10-CM

## 2023-06-26 DIAGNOSIS — I10 Essential (primary) hypertension: Secondary | ICD-10-CM | POA: Diagnosis not present

## 2023-06-26 LAB — CBC WITH DIFFERENTIAL/PLATELET
Basophils Absolute: 0 10*3/uL (ref 0.0–0.2)
Basos: 1 %
EOS (ABSOLUTE): 0.1 10*3/uL (ref 0.0–0.4)
Eos: 3 %
Hematocrit: 45 % (ref 34.0–46.6)
Hemoglobin: 15 g/dL (ref 11.1–15.9)
Immature Grans (Abs): 0 10*3/uL (ref 0.0–0.1)
Immature Granulocytes: 0 %
Lymphocytes Absolute: 1.8 10*3/uL (ref 0.7–3.1)
Lymphs: 42 %
MCH: 30.1 pg (ref 26.6–33.0)
MCHC: 33.3 g/dL (ref 31.5–35.7)
MCV: 90 fL (ref 79–97)
Monocytes Absolute: 0.4 10*3/uL (ref 0.1–0.9)
Monocytes: 8 %
Neutrophils Absolute: 2 10*3/uL (ref 1.4–7.0)
Neutrophils: 46 %
Platelets: 222 10*3/uL (ref 150–450)
RBC: 4.98 x10E6/uL (ref 3.77–5.28)
RDW: 13 % (ref 11.7–15.4)
WBC: 4.3 10*3/uL (ref 3.4–10.8)

## 2023-06-26 LAB — LIPID PANEL
Chol/HDL Ratio: 3.4 ratio (ref 0.0–4.4)
Cholesterol, Total: 186 mg/dL (ref 100–199)
HDL: 54 mg/dL (ref >39)
LDL Chol Calc (NIH): 101 mg/dL — ABNORMAL HIGH (ref 0–99)
Triglycerides: 181 mg/dL — ABNORMAL HIGH (ref 0–149)
VLDL Cholesterol Cal: 31 mg/dL (ref 5–40)

## 2023-06-26 LAB — COMPREHENSIVE METABOLIC PANEL WITH GFR
ALT: 18 IU/L (ref 0–32)
AST: 21 IU/L (ref 0–40)
Albumin: 4.3 g/dL (ref 3.8–4.8)
Alkaline Phosphatase: 113 IU/L (ref 44–121)
BUN/Creatinine Ratio: 13 (ref 12–28)
BUN: 10 mg/dL (ref 8–27)
Bilirubin Total: 0.6 mg/dL (ref 0.0–1.2)
CO2: 21 mmol/L (ref 20–29)
Calcium: 9.5 mg/dL (ref 8.7–10.3)
Chloride: 103 mmol/L (ref 96–106)
Creatinine, Ser: 0.8 mg/dL (ref 0.57–1.00)
Globulin, Total: 2.2 g/dL (ref 1.5–4.5)
Glucose: 119 mg/dL — ABNORMAL HIGH (ref 70–99)
Potassium: 3.8 mmol/L (ref 3.5–5.2)
Sodium: 140 mmol/L (ref 134–144)
Total Protein: 6.5 g/dL (ref 6.0–8.5)
eGFR: 75 mL/min/{1.73_m2} (ref >59)

## 2023-06-26 LAB — HEMOGLOBIN A1C
Est. average glucose Bld gHb Est-mCnc: 131 mg/dL
Hgb A1c MFr Bld: 6.2 % — ABNORMAL HIGH (ref 4.8–5.6)

## 2023-06-27 ENCOUNTER — Encounter: Payer: Self-pay | Admitting: Family Medicine

## 2023-06-27 NOTE — Progress Notes (Signed)
 Subjective:  Patient ID: Michelle Whitaker, female    DOB: [birth year]  Age: 78 y.o. MRN: [id number]  Chief Complaint  Patient presents with   Medical Management of Chronic Issues    HPI:  Hyperlipidemia:  Patient is currently taking Atorvastatin 10 mg taking 1/2 tablet daily and aspirin 81 mg daily.   Eats healthy most of the time. Exercising Walks at [employer]. 2 miles a couple of times a week.    Hypertension: Patient is currently taking Lisinopril  10 mg take 1 tablet daily, Verapamil  120 mg take 1 tablet twice daily. Bp 111/60-141/88, pulse has dropped 48-70. Occasionally, gets a catch below her left rib cage. Takes a deep breath, but does not make a difference. No shortness of breath, diaphoresis with this pain. Gets a pain across shoulder blades that occurs alone...without abdominal pain. Has not tried any medicines because it is so brief.    Prediabetes: Last A1c: 6.2%   Asthma, mild, intermittent: continue albuterol  HFA 2 puffs four times a day as needed. Rarely needs albuterol  hfa.    Allergies: restarted claritin and using flonase. Helping.      06/28/2023    7:54 AM 03/20/2023    3:08 PM 12/28/2022    8:35 AM 06/29/2022    8:54 AM 03/14/2022    9:05 AM  Depression screen PHQ 2/9  Decreased Interest 0 0 0 0 0  Down, Depressed, Hopeless 0 0 0 0 0  PHQ - 2 Score 0 0 0 0 0        06/28/2023    7:54 AM  Fall Risk   Falls in the past year? 0  Number falls in past yr: 0  Injury with Fall? 0  Risk for fall due to : No Fall Risks    Patient Care Team: Mercy Stall, MD as PCP - General (Family Medicine) Rockie Churchman, OD (Optometry)   Review of Systems  Constitutional:  Negative for chills, fatigue and fever.  HENT:  Negative for congestion, ear pain, rhinorrhea and sore throat.   Respiratory:  Negative for cough and shortness of breath.   Cardiovascular:  Negative for chest pain.  Gastrointestinal:  Negative for abdominal pain, constipation, diarrhea, nausea and vomiting.   Genitourinary:  Negative for dysuria and urgency.  Musculoskeletal:  Negative for back pain and myalgias.  Neurological:  Negative for dizziness, weakness, light-headedness and headaches.  Psychiatric/Behavioral:  Negative for dysphoric mood. The patient is not nervous/anxious.     Current Outpatient Medications on File Prior to Visit  Medication Sig Dispense Refill   albuterol  (VENTOLIN  HFA) 108 (90 Base) MCG/ACT inhaler Inhale 2 puffs into the lungs every 6 (six) hours as needed for wheezing or shortness of breath. 8 g 2   aspirin 81 MG tablet Take 81 mg by mouth daily.     atorvastatin (LIPITOR) 10 MG tablet TAKE 1/2 TABLET BY MOUTH DAILY 45 tablet 1   Cholecalciferol (VITAMIN D -3 PO) Take 800 Units by mouth daily.     fluticasone (FLONASE) 50 MCG/ACT nasal spray SPRAY 2 SPRAYS INTO EACH NOSTRIL EVERY DAY 48 mL 1   lisinopril  (ZESTRIL ) 10 MG tablet Take 10 mg by mouth daily.     loratadine (CLARITIN) 10 MG tablet Take 10 mg by mouth daily.     Metamucil Fiber CHEW Chew by mouth in the morning and at bedtime.     No current facility-administered medications on file prior to visit.   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   Past Surgical History:  Procedure Laterality Date   BREAST EXCISIONAL BIOPSY     BREAST SURGERY     right breast/benign   CATARACT EXTRACTION, BILATERAL     CERVICAL BIOPSY     benign   COLONOSCOPY     DILATION AND CURETTAGE OF UTERUS     due to heavy bleeding   herniated disc  1991   lower back   POLYPECTOMY      Family History  Problem Relation Age of Onset   Heart disease Mother    Stroke Father    Hypertension Sister    Dementia Sister    Colon  cancer Neg Hx    Colon polyps Neg Hx    Esophageal cancer Neg Hx    Rectal cancer Neg Hx    Stomach cancer Neg Hx    Breast cancer Neg Hx    Social History   Socioeconomic History   Marital status: Widowed    Spouse name: Not on file   Number of children: 2   Years of education: Not on file   Highest education level: Not on file  Occupational History   Occupation: Retired Neurosurgeon  Tobacco Use   Smoking status: Never   Smokeless tobacco: Never  Vaping Use   Vaping status: Never Used  Substance and Sexual Activity   Alcohol use: Never   Drug use: No   Sexual activity: Not Currently    Partners: Male  Other Topics Concern   Not on file  Social History Narrative   1 daughter lives in [place],   [place], dtr-in-law, grand-dtr live in [place]   Very active around home   Social Drivers of Health   Financial Resource Strain: Low Risk  (03/20/2023)   Overall Financial Resource Strain (CARDIA)    Difficulty of Paying Living Expenses: Not hard at all  Food Insecurity: No Food Insecurity (03/20/2023)   Hunger Vital Sign    Worried About Running Out of Food in the Last Year: Never true    Ran Out of Food in the Last Year: Never true  Transportation Needs: No Transportation Needs (03/20/2023)   PRAPARE - Administrator, Civil Service (Medical): No    Lack of Transportation (Non-Medical): No  Physical Activity: Insufficiently Active (03/20/2023)   Exercise Vital Sign    Days of Exercise per Week: 6 days    Minutes of Exercise per Session: 20 min  Stress: No Stress Concern Present (03/20/2023)   [employer] of Occupational Health - Occupational Stress Questionnaire    Feeling of Stress : Not at all  Social Connections: Moderately Integrated (03/20/2023)   Social Connection and Isolation Panel [NHANES]    Frequency of Communication with Friends and Family: More than three times a week    Frequency of Social Gatherings with Friends and Family: Three times a week     Attends Religious Services: More than 4 times per year    Active Member of Clubs or Organizations: Yes    Attends Banker Meetings: More than 4 times per year  Marital Status: Widowed    Objective:  BP 130/78   Pulse 70   Temp 97.9 F (36.6 C)   Ht 5' 3.75" (1.619 m)   Wt 140 lb (63.5 kg)   SpO2 97%   BMI 24.22 kg/m      06/28/2023    7:52 AM 05/22/2023    9:14 AM 05/22/2023    9:04 AM  BP/Weight  Systolic BP 130 144 160  Diastolic BP 78 72 80  Wt. (Lbs) 140  141.8  BMI 24.22 kg/m2  24.53 kg/m2    Physical Exam Vitals reviewed.  Constitutional:      Appearance: Normal appearance. She is normal weight.  Neck:     Vascular: No carotid bruit.  Cardiovascular:     Rate and Rhythm: Regular rhythm. Bradycardia present.     Heart sounds: Murmur heard.  Pulmonary:     Effort: Pulmonary effort is normal. No respiratory distress.     Breath sounds: Normal breath sounds.  Abdominal:     General: Abdomen is flat. Bowel sounds are normal.     Palpations: Abdomen is soft.     Tenderness: There is no abdominal tenderness.  Musculoskeletal:     Comments: NONTENDER OVER BACK.  Neurological:     Mental Status: She is alert and oriented to person, place, and time.  Psychiatric:        Mood and Affect: Mood normal.        Behavior: Behavior normal.     Diabetic Foot Exam - Simple   No data filed      Lab Results  Component Value Date   WBC 4.3 06/26/2023   HGB 15.0 06/26/2023   HCT 45.0 06/26/2023   PLT 222 06/26/2023   GLUCOSE 119 (H) 06/26/2023   CHOL 186 06/26/2023   TRIG 181 (H) 06/26/2023   HDL 54 06/26/2023   LDLCALC 101 (H) 06/26/2023   ALT 18 06/26/2023   AST 21 06/26/2023   NA 140 06/26/2023   K 3.8 06/26/2023   CL 103 06/26/2023   CREATININE 0.80 06/26/2023   BUN 10 06/26/2023   CO2 21 06/26/2023   TSH 2.490 12/26/2022   HGBA1C 6.2 (H) 06/26/2023      Assessment & Plan:  Bradycardia Assessment & Plan: ORDER EKG CHANGE  VERAPAMIL  TO 40 MG TWICE A DAY.   Orders: -     EKG 12-Lead -     Verapamil  HCl; Take 2 tablets (80 mg total) by mouth 2 (two) times daily.  Dispense: 60 tablet; Refill: 2  Prediabetes  Mixed hyperlipidemia Assessment & Plan: Well controlled.  No changes to medicines. Continue atorvastatin 10 mg 1/2 tablet daily before bed.  Continue to work on eating a healthy diet and exercise.     Orders: -     CBC with Differential/Platelet; Future -     Comprehensive metabolic panel with GFR; Future -     Lipid panel; Future -     TSH; Future  Essential hypertension, benign Assessment & Plan: Not at goal CHANGE VERAPAMIL  TO 40 MG TWICE A DAY.  CONTINUE LISINOPRIL .  Continue to work on eating a healthy diet and exercise.  Labs reviewed.      Other osteoporosis without current pathological fracture  -     DG Bone Density; Future  Mild intermittent asthma without complication Assessment & Plan: Uses albuterol  hfa 2 puffs four times a day as needed    Murmur Assessment & Plan: ORDER ECHOCARDIOGRAM.  Orders: -  ECHOCARDIOGRAM COMPLETE; Future  Need for hepatitis C screening test -     HCV Ab w Reflex to Quant PCR; Future     Meds ordered this encounter  Medications   verapamil  (CALAN ) 40 MG tablet    Sig: Take 2 tablets (80 mg total) by mouth 2 (two) times daily.    Dispense:  60 tablet    Refill:  2    Orders Placed This Encounter  Procedures   DG Bone Density   CBC with Differential/Platelet   Comprehensive metabolic panel with GFR   Lipid panel   TSH   HCV Ab w Reflex to Quant PCR   EKG 12-Lead   ECHOCARDIOGRAM COMPLETE     Follow-up: Return in about 2 weeks (around 07/12/2023) for Dina.   I,Katherina A Bramblett,acting as a scribe for Mercy Stall, MD.,have documented all relevant documentation on the behalf of Mercy Stall, MD,as directed by  Mercy Stall, MD while in the presence of Mercy Stall, MD.   An After Visit Summary was printed and given to  the patient.  Mercy Stall, MD [facility] [phone]

## 2023-06-28 ENCOUNTER — Ambulatory Visit: Payer: Medicare Other | Admitting: Family Medicine

## 2023-06-28 ENCOUNTER — Encounter: Payer: Self-pay | Admitting: Family Medicine

## 2023-06-28 VITALS — BP 130/78 | HR 70 | Temp 97.9°F | Ht 63.75 in | Wt 140.0 lb

## 2023-06-28 DIAGNOSIS — I10 Essential (primary) hypertension: Secondary | ICD-10-CM | POA: Diagnosis not present

## 2023-06-28 DIAGNOSIS — R001 Bradycardia, unspecified: Secondary | ICD-10-CM | POA: Diagnosis not present

## 2023-06-28 DIAGNOSIS — Z1159 Encounter for screening for other viral diseases: Secondary | ICD-10-CM | POA: Diagnosis not present

## 2023-06-28 DIAGNOSIS — R011 Cardiac murmur, unspecified: Secondary | ICD-10-CM

## 2023-06-28 DIAGNOSIS — R7303 Prediabetes: Secondary | ICD-10-CM

## 2023-06-28 DIAGNOSIS — E782 Mixed hyperlipidemia: Secondary | ICD-10-CM

## 2023-06-28 DIAGNOSIS — M818 Other osteoporosis without current pathological fracture: Secondary | ICD-10-CM | POA: Diagnosis not present

## 2023-06-28 DIAGNOSIS — J4520 Mild intermittent asthma, uncomplicated: Secondary | ICD-10-CM | POA: Diagnosis not present

## 2023-06-28 MED ORDER — VERAPAMIL HCL 40 MG PO TABS: 80.0000 mg | ORAL_TABLET | Freq: Two times a day (BID) | ORAL | 2 refills | Status: DC

## 2023-06-28 NOTE — Assessment & Plan Note (Signed)
 ORDER EKG CHANGE VERAPAMIL TO 40 MG TWICE A DAY.

## 2023-06-28 NOTE — Patient Instructions (Signed)
 CHANGE VERAPAMIL TO 40 MG TWICE A DAY.  CONTINUE LISINOPRIL .  ORDER ECHOCARDIOGRAM FOR MURMUR.

## 2023-06-28 NOTE — Assessment & Plan Note (Signed)
Uses albuterol hfa 2 puffs four times a day as needed

## 2023-06-28 NOTE — Assessment & Plan Note (Signed)
 Well controlled.  No changes to medicines. Continue atorvastatin 10 mg 1/2 tablet daily before bed.  Continue to work on eating a healthy diet and exercise.

## 2023-06-28 NOTE — Assessment & Plan Note (Signed)
 ORDER: ECHOCARDIOGRAM

## 2023-06-28 NOTE — Assessment & Plan Note (Signed)
 Not at goal CHANGE VERAPAMIL TO 40 MG TWICE A DAY.  CONTINUE LISINOPRIL .  Continue to work on eating a healthy diet and exercise.  Labs reviewed.

## 2023-06-29 ENCOUNTER — Ambulatory Visit (HOSPITAL_BASED_OUTPATIENT_CLINIC_OR_DEPARTMENT_OTHER)
Admission: RE | Admit: 2023-06-29 | Discharge: 2023-06-29 | Disposition: A | Discharge status: Home / Self Care | Source: Ambulatory Visit | Attending: Family Medicine | Admitting: Family Medicine

## 2023-06-29 DIAGNOSIS — M810 Age-related osteoporosis without current pathological fracture: Secondary | ICD-10-CM | POA: Diagnosis not present

## 2023-06-29 DIAGNOSIS — M818 Other osteoporosis without current pathological fracture: Secondary | ICD-10-CM | POA: Diagnosis not present

## 2023-06-29 DIAGNOSIS — Z780 Asymptomatic menopausal state: Secondary | ICD-10-CM | POA: Diagnosis not present

## 2023-07-02 ENCOUNTER — Encounter: Payer: Self-pay | Admitting: Family Medicine

## 2023-07-03 ENCOUNTER — Ambulatory Visit: Payer: Self-pay

## 2023-07-12 ENCOUNTER — Ambulatory Visit (INDEPENDENT_AMBULATORY_CARE_PROVIDER_SITE_OTHER): Admitting: Family Medicine

## 2023-07-12 ENCOUNTER — Encounter: Payer: Self-pay | Admitting: Family Medicine

## 2023-07-12 VITALS — BP 130/76 | HR 60 | Temp 97.8°F | Resp 16 | Ht 63.75 in | Wt 140.2 lb

## 2023-07-12 DIAGNOSIS — R001 Bradycardia, unspecified: Secondary | ICD-10-CM | POA: Diagnosis not present

## 2023-07-12 DIAGNOSIS — I10 Essential (primary) hypertension: Secondary | ICD-10-CM

## 2023-07-12 NOTE — Assessment & Plan Note (Signed)
 Improved Reading at home range from 52-86 Pulse Readings from Last 3 Encounters:  07/12/23 60  06/28/23 70  05/22/23 88    -Continue VERAPAMIL  TO 40 MG TWICE A DAY.

## 2023-07-12 NOTE — Progress Notes (Signed)
 Subjective:  Patient ID: Michelle Whitaker, female    DOB: 05-03-1945  Age: 78 y.o. MRN: [id number]  Chief Complaint  Patient presents with   Medical Management of Chronic Issues    lightheadedness   Hypertension    HPI:  Hypertension: [name]. Lorenzo is in today for follow-up on recent medication change. She was on Verapamil  120 mg by mouth TWICE A DAY. She was experiencing bradycardia with her heart rate in the 40's and her PCP adjusted her medications to help bring her heart rate up. She changed her Verapamil  to 40 mg TWICE A DAY and lisinopril  10 mg. Her BP have been 126/67 - 152/90 with her heart rate between 52-86. She has noticed an improvement and admits that it may be too soon to tell because her body is sensitive to medication changes. She denies headaches, blurred vision, chest pain, shortness of breath or leg swelling. She states that she does have some lightheadedness but not as much as before and reports no shoulder pain as mentioned before. [name] also notices a dry cough that has started and thinks it may have something to due with the lisinopril . She admits that this medication is the only one she finds helps her BP.      06/28/2023    7:54 AM 03/20/2023    3:08 PM 12/28/2022    8:35 AM 06/29/2022    8:54 AM 03/14/2022    9:05 AM  Depression screen PHQ 2/9  Decreased Interest 0 0 0 0 0  Down, Depressed, Hopeless 0 0 0 0 0  PHQ - 2 Score 0 0 0 0 0        06/28/2023    7:54 AM  Fall Risk   Falls in the past year? 0  Number falls in past yr: 0  Injury with Fall? 0  Risk for fall due to : No Fall Risks    Patient Care Team: Mercy Stall, MD as PCP - General (Family Medicine) Rockie Churchman, OD (Optometry)   Review of Systems  Constitutional:  Positive for fatigue. Negative for chills and fever.  HENT:  Negative for congestion, sinus pressure, sinus pain, sneezing and sore throat.   Eyes: Negative.   Respiratory:  Negative for cough, chest tightness and shortness of breath.    Cardiovascular:  Negative for chest pain, palpitations and leg swelling.  Gastrointestinal:  Negative for constipation, diarrhea, nausea and vomiting.  Endocrine: Negative.   Genitourinary:  Negative for dysuria, frequency and urgency.  Musculoskeletal:  Negative for back pain.  Skin: Negative.   Allergic/Immunologic: Negative.   Neurological:  Positive for light-headedness. Negative for dizziness and headaches.  Hematological: Negative.   Psychiatric/Behavioral: Negative.      Current Outpatient Medications on File Prior to Visit  Medication Sig Dispense Refill   albuterol  (VENTOLIN  HFA) 108 (90 Base) MCG/ACT inhaler Inhale 2 puffs into the lungs every 6 (six) hours as needed for wheezing or shortness of breath. 8 g 2   aspirin 81 MG tablet Take 81 mg by mouth daily.     atorvastatin (LIPITOR) 10 MG tablet TAKE 1/2 TABLET BY MOUTH DAILY 45 tablet 1   Cholecalciferol (VITAMIN D -3 PO) Take 800 Units by mouth daily.     fluticasone (FLONASE) 50 MCG/ACT nasal spray SPRAY 2 SPRAYS INTO EACH NOSTRIL EVERY DAY 48 mL 1   lisinopril  (ZESTRIL ) 10 MG tablet Take 10 mg by mouth daily.     loratadine (CLARITIN) 10 MG tablet Take 10 mg by mouth  daily.     Metamucil Fiber CHEW Chew by mouth in the morning and at bedtime.     verapamil  (CALAN ) 40 MG tablet Take 2 tablets (80 mg total) by mouth 2 (two) times daily. 60 tablet 2   No current facility-administered medications on file prior to visit.   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   Past Surgical History:  Procedure Laterality Date   BREAST EXCISIONAL BIOPSY     BREAST SURGERY     right breast/benign   CATARACT EXTRACTION, BILATERAL      CERVICAL BIOPSY     benign   COLONOSCOPY     DILATION AND CURETTAGE OF UTERUS     due to heavy bleeding   herniated disc  1991   lower back   POLYPECTOMY      Family History  Problem Relation Age of Onset   Heart disease Mother    Stroke Father    Hypertension Sister    Dementia Sister    Colon cancer Neg Hx    Colon polyps Neg Hx    Esophageal cancer Neg Hx    Rectal cancer Neg Hx    Stomach cancer Neg Hx    Breast cancer Neg Hx    Social History   Socioeconomic History   Marital status: Widowed    Spouse name: Not on file   Number of children: 2   Years of education: Not on file   Highest education level: Not on file  Occupational History   Occupation: Retired Neurosurgeon  Tobacco Use   Smoking status: Never   Smokeless tobacco: Never  Vaping Use   Vaping status: Never Used  Substance and Sexual Activity   Alcohol use: Never   Drug use: No   Sexual activity: Not Currently    Partners: Male  Other Topics Concern   Not on file  Social History Narrative   1 daughter lives in [place],   [place], dtr-in-law, grand-dtr live in [place]   Very active around home   Social Drivers of Health   Financial Resource Strain: Low Risk  (03/20/2023)   Overall Financial Resource Strain (CARDIA)    Difficulty of Paying Living Expenses: Not hard at all  Food Insecurity: No Food Insecurity (03/20/2023)   Hunger Vital Sign    Worried About Running Out of Food in the Last Year: Never true    Ran Out of Food in the Last Year: Never true  Transportation Needs: No Transportation Needs (03/20/2023)   PRAPARE - Administrator, Civil Service (Medical): No    Lack of Transportation (Non-Medical): No  Physical Activity: Insufficiently Active (03/20/2023)   Exercise Vital Sign    Days of Exercise per Week: 6 days    Minutes of Exercise per Session: 20 min  Stress: No Stress Concern Present (03/20/2023)   [employer] of Occupational Health - Occupational Stress  Questionnaire    Feeling of Stress : Not at all  Social Connections: Moderately Integrated (03/20/2023)   Social Connection and Isolation Panel [NHANES]    Frequency  of Communication with Friends and Family: More than three times a week    Frequency of Social Gatherings with Friends and Family: Three times a week    Attends Religious Services: More than 4 times per year    Active Member of Clubs or Organizations: Yes    Attends Banker Meetings: More than 4 times per year    Marital Status: Widowed    Objective:  BP 130/76   Pulse 60   Temp 97.8 F (36.6 C) (Temporal)   Resp 16   Ht 5' 3.75" (1.619 m)   Wt 140 lb 3.2 oz (63.6 kg)   SpO2 96%   BMI 24.25 kg/m      07/12/2023    9:26 AM 06/28/2023    7:52 AM 05/22/2023    9:14 AM  BP/Weight  Systolic BP 130 130 144  Diastolic BP 76 78 72  Wt. (Lbs) 140.2 140   BMI 24.25 kg/m2 24.22 kg/m2     Physical Exam Vitals reviewed.  Constitutional:      General: She is not in acute distress.    Appearance: Normal appearance. She is not ill-appearing.  Eyes:     Conjunctiva/sclera: Conjunctivae normal.  Cardiovascular:     Rate and Rhythm: Normal rate and regular rhythm.     Heart sounds: Normal heart sounds. No murmur heard. Pulmonary:     Effort: Pulmonary effort is normal.     Breath sounds: Normal breath sounds. No wheezing.  Musculoskeletal:        General: Normal range of motion.  Skin:    General: Skin is warm.  Neurological:     Mental Status: She is alert. Mental status is at baseline.  Psychiatric:        Mood and Affect: Mood normal.        Behavior: Behavior normal.     Lab Results  Component Value Date   WBC 4.3 06/26/2023   HGB 15.0 06/26/2023   HCT 45.0 06/26/2023   PLT 222 06/26/2023   GLUCOSE 119 (H) 06/26/2023   CHOL 186 06/26/2023   TRIG 181 (H) 06/26/2023   HDL 54 06/26/2023   LDLCALC 101 (H) 06/26/2023   ALT 18 06/26/2023   AST 21 06/26/2023   NA 140 06/26/2023   K 3.8  06/26/2023   CL 103 06/26/2023   CREATININE 0.80 06/26/2023   BUN 10 06/26/2023   CO2 21 06/26/2023   TSH 2.490 12/26/2022   HGBA1C 6.2 (H) 06/26/2023      Assessment & Plan:  Essential hypertension, benign Assessment & Plan: Stable BP Readings from Last 3 Encounters:  07/12/23 130/76  06/28/23 130/78  05/22/23 (!) 144/72  - Continue VERAPAMIL  TO 40 MG TWICE A DAY.  - CONTINUE LISINOPRIL  10 mg by mouth once daily - Continue to work on eating a healthy diet and exercise.  - Discussed continue BP log to monitor for BP changes - Also discussed changing her ACE due to cough and adding an additional BP for better BP control.      Bradycardia Assessment & Plan: Improved Reading at home range from 52-86 Pulse Readings from Last 3 Encounters:  07/12/23 60  06/28/23 70  05/22/23 88    -Continue VERAPAMIL  TO 40 MG TWICE A DAY.     Follow-up: Return if symptoms worsen or fail to improve and keep appt with Dr. Reinhold Carbine in Aug.  An After Visit Summary was printed and given to the patient.  Delford Felling, FNP [facility] [phone]

## 2023-07-12 NOTE — Assessment & Plan Note (Addendum)
 Stable BP Readings from Last 3 Encounters:  07/12/23 130/76  06/28/23 130/78  05/22/23 (!) 144/72  - Continue VERAPAMIL  TO 40 MG TWICE A DAY.  - CONTINUE LISINOPRIL  10 mg by mouth once daily - Continue to work on eating a healthy diet and exercise.  - Discussed continue BP log to monitor for BP changes - Also discussed changing her ACE due to cough and adding an additional BP for better BP control.

## 2023-07-13 ENCOUNTER — Other Ambulatory Visit: Payer: Self-pay | Admitting: Family Medicine

## 2023-07-19 ENCOUNTER — Ambulatory Visit: Attending: Family Medicine

## 2023-07-19 DIAGNOSIS — R011 Cardiac murmur, unspecified: Secondary | ICD-10-CM | POA: Insufficient documentation

## 2023-07-20 LAB — ECHOCARDIOGRAM COMPLETE
AV Vena cont: 0.3 cm
Area-P 1/2: 2.39 cm2
P 1/2 time: 490 ms
S' Lateral: 2.7 cm

## 2023-07-27 ENCOUNTER — Other Ambulatory Visit: Payer: Self-pay

## 2023-07-27 DIAGNOSIS — R001 Bradycardia, unspecified: Secondary | ICD-10-CM

## 2023-07-27 MED ORDER — VERAPAMIL HCL 80 MG PO TABS: 80.0000 mg | ORAL_TABLET | Freq: Two times a day (BID) | ORAL | 1 refills | Status: DC

## 2023-07-27 NOTE — Telephone Encounter (Signed)
 Copied from CRM [id number]. Topic: Clinical - Prescription Issue >> Jul 27, 2023  9:35 AM Rosamond Comes wrote: Reason for CRM: patient calling in requesting the prescription for  verapamil  (CALAN ) 40 MG tablet be written for a higher quantity of dispensed tablets,  to cut down the amount of refill requests.

## 2023-08-17 ENCOUNTER — Other Ambulatory Visit: Payer: Self-pay | Admitting: Physician Assistant

## 2023-08-17 DIAGNOSIS — J069 Acute upper respiratory infection, unspecified: Secondary | ICD-10-CM

## 2023-09-14 ENCOUNTER — Other Ambulatory Visit: Payer: Self-pay | Admitting: Family Medicine

## 2023-10-01 ENCOUNTER — Other Ambulatory Visit: Payer: Self-pay

## 2023-10-01 DIAGNOSIS — E782 Mixed hyperlipidemia: Secondary | ICD-10-CM

## 2023-10-01 DIAGNOSIS — Z1159 Encounter for screening for other viral diseases: Secondary | ICD-10-CM

## 2023-10-02 ENCOUNTER — Other Ambulatory Visit

## 2023-10-02 DIAGNOSIS — E782 Mixed hyperlipidemia: Secondary | ICD-10-CM | POA: Diagnosis not present

## 2023-10-02 DIAGNOSIS — Z1159 Encounter for screening for other viral diseases: Secondary | ICD-10-CM | POA: Diagnosis not present

## 2023-10-03 LAB — COMPREHENSIVE METABOLIC PANEL WITH GFR
ALT: 27 IU/L (ref 0–32)
AST: 26 IU/L (ref 0–40)
Albumin: 4.2 g/dL (ref 3.8–4.8)
Alkaline Phosphatase: 121 IU/L (ref 44–121)
BUN/Creatinine Ratio: 17 (ref 12–28)
BUN: 12 mg/dL (ref 8–27)
Bilirubin Total: 0.6 mg/dL (ref 0.0–1.2)
CO2: 20 mmol/L (ref 20–29)
Calcium: 9.1 mg/dL (ref 8.7–10.3)
Chloride: 100 mmol/L (ref 96–106)
Creatinine, Ser: 0.69 mg/dL (ref 0.57–1.00)
Globulin, Total: 2.6 g/dL (ref 1.5–4.5)
Glucose: 149 mg/dL — ABNORMAL HIGH (ref 70–99)
Potassium: 3.7 mmol/L (ref 3.5–5.2)
Sodium: 139 mmol/L (ref 134–144)
Total Protein: 6.8 g/dL (ref 6.0–8.5)
eGFR: 89 mL/min/1.73 (ref >59)

## 2023-10-03 LAB — CBC WITH DIFFERENTIAL/PLATELET
Basophils Absolute: 0 x10E3/uL (ref 0.0–0.2)
Basos: 1 %
EOS (ABSOLUTE): 0.1 x10E3/uL (ref 0.0–0.4)
Eos: 4 %
Hematocrit: 46.5 % (ref 34.0–46.6)
Hemoglobin: 15.5 g/dL (ref 11.1–15.9)
Immature Grans (Abs): 0 x10E3/uL (ref 0.0–0.1)
Immature Granulocytes: 0 %
Lymphocytes Absolute: 1.5 x10E3/uL (ref 0.7–3.1)
Lymphs: 42 %
MCH: 29.8 pg (ref 26.6–33.0)
MCHC: 33.3 g/dL (ref 31.5–35.7)
MCV: 89 fL (ref 79–97)
Monocytes Absolute: 0.4 x10E3/uL (ref 0.1–0.9)
Monocytes: 12 %
Neutrophils Absolute: 1.5 x10E3/uL (ref 1.4–7.0)
Neutrophils: 41 %
Platelets: 194 x10E3/uL (ref 150–450)
RBC: 5.2 x10E6/uL (ref 3.77–5.28)
RDW: 13 % (ref 11.7–15.4)
WBC: 3.6 x10E3/uL (ref 3.4–10.8)

## 2023-10-03 LAB — LIPID PANEL
Chol/HDL Ratio: 3.9 ratio (ref 0.0–4.4)
Cholesterol, Total: 174 mg/dL (ref 100–199)
HDL: 45 mg/dL (ref >39)
LDL Chol Calc (NIH): 99 mg/dL (ref 0–99)
Triglycerides: 171 mg/dL — ABNORMAL HIGH (ref 0–149)
VLDL Cholesterol Cal: 30 mg/dL (ref 5–40)

## 2023-10-03 LAB — HCV AB W REFLEX TO QUANT PCR: HCV Ab: NONREACTIVE

## 2023-10-03 LAB — TSH: TSH: 2.57 u[IU]/mL (ref 0.450–4.500)

## 2023-10-03 LAB — HCV INTERPRETATION

## 2023-10-04 ENCOUNTER — Ambulatory Visit (INDEPENDENT_AMBULATORY_CARE_PROVIDER_SITE_OTHER): Admitting: Family Medicine

## 2023-10-04 ENCOUNTER — Encounter: Payer: Self-pay | Admitting: Family Medicine

## 2023-10-04 VITALS — BP 130/70 | HR 84 | Temp 97.2°F | Resp 16 | Ht 63.0 in | Wt 140.0 lb

## 2023-10-04 DIAGNOSIS — J00 Acute nasopharyngitis [common cold]: Secondary | ICD-10-CM | POA: Insufficient documentation

## 2023-10-04 DIAGNOSIS — R7303 Prediabetes: Secondary | ICD-10-CM

## 2023-10-04 DIAGNOSIS — J4520 Mild intermittent asthma, uncomplicated: Secondary | ICD-10-CM

## 2023-10-04 DIAGNOSIS — E782 Mixed hyperlipidemia: Secondary | ICD-10-CM | POA: Diagnosis not present

## 2023-10-04 DIAGNOSIS — I10 Essential (primary) hypertension: Secondary | ICD-10-CM

## 2023-10-04 DIAGNOSIS — J208 Acute bronchitis due to other specified organisms: Secondary | ICD-10-CM | POA: Insufficient documentation

## 2023-10-04 LAB — POCT INFLUENZA A/B
Influenza A, POC: NEGATIVE
Influenza B, POC: NEGATIVE

## 2023-10-04 LAB — POC COVID19 BINAXNOW: SARS Coronavirus 2 Ag: NEGATIVE

## 2023-10-04 LAB — POCT RAPID STREP A (OFFICE): Rapid Strep A Screen: NEGATIVE

## 2023-10-04 MED ORDER — ALBUTEROL SULFATE HFA 108 (90 BASE) MCG/ACT IN AERS: 2.0000 | INHALATION_SPRAY | Freq: Four times a day (QID) | RESPIRATORY_TRACT | 1 refills | Status: AC | PRN

## 2023-10-04 NOTE — Assessment & Plan Note (Signed)
 Uses albuterol  hfa 2 puffs four times a day as needed

## 2023-10-04 NOTE — Assessment & Plan Note (Addendum)
 Well controlled. Labs reviewed.  No changes to medicines. Continue atorvastatin 10 mg 1/2 tablet daily before bed.  Continue to work on eating a healthy diet and exercise.

## 2023-10-04 NOTE — Progress Notes (Signed)
 Subjective:  Patient ID: Michelle Whitaker, female    DOB: [birth year]  Age: 78 y.o. MRN: [id number]  Chief Complaint  Patient presents with   Medical Management of Chronic Issues    HPI: Hyperlipidemia:  Patient is currently taking Atorvastatin 10 mg taking 1/2 tablet daily and aspirin 81 mg daily.   Eats healthy most of the time. Exercising Walks at [employer]. 2 miles a couple of times a week.    Hypertension: Patient is currently taking Lisinopril  10 mg take 1 tablet daily, Verapamil  80 mg take 1 tablet twice daily.   Prediabetes: Last A1c: 6.2%   Asthma, mild, intermittent: continue albuterol  HFA 2 puffs four times a day as needed. Rarely needs albuterol  hfa.    Allergies: on claritin and using flonase. Helping.   Patient mentioned has cough, chills, headache, sinus pressure and chest congestion since one week ago.   Stepped on a yellow jacket nest about 3 weeks ago. Multiple stings on both feet. Was very painful. Improving but still with some mild swelling.      10/04/2023    9:21 AM 06/28/2023    7:54 AM 03/20/2023    3:08 PM 12/28/2022    8:35 AM 06/29/2022    8:54 AM  Depression screen PHQ 2/9  Decreased Interest 0 0 0 0 0  Down, Depressed, Hopeless 0 0 0 0 0  PHQ - 2 Score 0 0 0 0 0  Altered sleeping 0      Tired, decreased energy 0      Change in appetite 0      Feeling bad or failure about yourself  0      Trouble concentrating 0      Moving slowly or fidgety/restless 0      Suicidal thoughts 0      PHQ-9 Score 0      Difficult doing work/chores Not difficult at all            10/04/2023    9:20 AM  Fall Risk   Falls in the past year? 0  Number falls in past yr: 0  Injury with Fall? 0  Risk for fall due to : No Fall Risks  Follow up Falls evaluation completed    Patient Care Team: Whitaker Clapper, MD as PCP - General (Family Medicine) Arloa Brunner, OD (Optometry)   Review of Systems  Constitutional:  Positive for chills. Negative for fatigue and fever.  HENT:   Positive for congestion, postnasal drip, sinus pressure and sore throat. Negative for ear pain.   Respiratory:  Positive for cough. Negative for shortness of breath.   Cardiovascular:  Negative for chest pain.  Gastrointestinal:  Positive for abdominal pain and diarrhea. Negative for constipation, nausea and vomiting.  Genitourinary:  Negative for dysuria and urgency.  Musculoskeletal:  Negative for arthralgias and myalgias.  Skin:  Negative for rash.  Neurological:  Positive for headaches. Negative for dizziness.  Psychiatric/Behavioral:  Negative for dysphoric mood. The patient is not nervous/anxious.     Current Outpatient Medications on File Prior to Visit  Medication Sig Dispense Refill   aspirin 81 MG tablet Take 81 mg by mouth daily.     atorvastatin (LIPITOR) 10 MG tablet TAKE 1/2 TABLET BY MOUTH DAILY 45 tablet 1   Cholecalciferol (VITAMIN D -3 PO) Take 800 Units by mouth daily.     fluticasone (FLONASE) 50 MCG/ACT nasal spray SPRAY 2 SPRAYS INTO EACH NOSTRIL EVERY DAY 48 mL 1   lisinopril  (ZESTRIL ) 10  MG tablet TAKE 1 TABLET BY MOUTH EVERY DAY 90 tablet 1   loratadine (CLARITIN) 10 MG tablet Take 10 mg by mouth daily.     Metamucil Fiber CHEW Chew by mouth in the morning and at bedtime.     verapamil  (CALAN ) 80 MG tablet Take 1 tablet (80 mg total) by mouth 2 (two) times daily. 180 tablet 1   No current facility-administered medications on file prior to visit.   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   Past Surgical History:  Procedure Laterality Date   BREAST EXCISIONAL BIOPSY     BREAST SURGERY     right breast/benign   CATARACT EXTRACTION, BILATERAL     CERVICAL BIOPSY      benign   COLONOSCOPY     DILATION AND CURETTAGE OF UTERUS     due to heavy bleeding   herniated disc  1991   lower back   POLYPECTOMY      Family History  Problem Relation Age of Onset   Heart disease Mother    Stroke Father    Hypertension Sister    Dementia Sister    Colon cancer Neg Hx    Colon polyps Neg Hx    Esophageal cancer Neg Hx    Rectal cancer Neg Hx    Stomach cancer Neg Hx    Breast cancer Neg Hx    Social History   Socioeconomic History   Marital status: Widowed    Spouse name: Not on file   Number of children: 2   Years of education: Not on file   Highest education level: Not on file  Occupational History   Occupation: Retired Neurosurgeon  Tobacco Use   Smoking status: Never   Smokeless tobacco: Never  Vaping Use   Vaping status: Never Used  Substance and Sexual Activity   Alcohol use: Never   Drug use: No   Sexual activity: Not Currently    Partners: Male  Other Topics Concern   Not on file  Social History Narrative   1 daughter lives in [place],   [place], dtr-in-law, grand-dtr live in [place]   Very active around home   Social Drivers of Health   Financial Resource Strain: Low Risk  (03/20/2023)   Overall Financial Resource Strain (CARDIA)    Difficulty of Paying Living Expenses: Not hard at all  Food Insecurity: No Food Insecurity (03/20/2023)   Hunger Vital Sign    Worried About Running Out of Food in the Last Year: Never true    Ran Out of Food in the Last Year: Never true  Transportation Needs: No Transportation Needs (03/20/2023)   PRAPARE - Administrator, Civil Service (Medical): No    Lack of Transportation (Non-Medical): No  Physical Activity: Insufficiently Active (03/20/2023)   Exercise Vital Sign    Days of Exercise per Week: 6 days    Minutes of Exercise per Session: 20 min  Stress: No Stress Concern Present (03/20/2023)   [employer] of Occupational Health - Occupational Stress Questionnaire    Feeling  of Stress : Not at all  Social Connections: Moderately Integrated (03/20/2023)   Social Connection and Isolation Panel    Frequency of Communication with Friends and Family: More than three times a week    Frequency of Social Gatherings with Friends and Family: Three times a week    Attends Religious Services: More than 4 times per year    Active Member of Clubs or Organizations: Yes    Attends Banker Meetings: More than 4 times per year    Marital Status: Widowed    Objective:  BP 130/70   Pulse 84   Temp (!) 97.2 F (36.2 C)   Resp 16   Ht 5' 3 (1.6 m)   Wt 140 lb (63.5 kg)   SpO2 97%   BMI 24.80 kg/m      10/04/2023    8:58 AM 07/12/2023    9:26 AM 06/28/2023    7:52 AM  BP/Weight  Systolic BP 130 130 130  Diastolic BP 70 76 78  Wt. (Lbs) 140 140.2 140  BMI 24.8 kg/m2 24.25 kg/m2 24.22 kg/m2    Physical Exam Vitals reviewed.  Constitutional:      Appearance: Normal appearance. She is normal weight.  HENT:     Right Ear: Tympanic membrane normal.     Left Ear: Tympanic membrane normal.     Nose: Congestion and rhinorrhea present.     Mouth/Throat:     Pharynx: No oropharyngeal exudate or posterior oropharyngeal erythema.  Neck:     Vascular: No carotid bruit.  Cardiovascular:     Rate and Rhythm: Normal rate and regular rhythm.     Heart sounds: Normal heart sounds.  Pulmonary:     Effort: Pulmonary effort is normal. No respiratory distress.     Breath sounds: Normal breath sounds.  Abdominal:     General: Abdomen is flat. Bowel sounds are normal.     Palpations: Abdomen is soft.     Tenderness: There is no abdominal tenderness.  Musculoskeletal:     Right lower leg: Edema (trace) present.     Left lower leg: Edema (trace) present.  Neurological:     Mental Status: She is alert and oriented to person, place, and time.  Psychiatric:        Mood and Affect: Mood normal.        Behavior: Behavior normal.         Lab Results  Component  Value Date   WBC 3.6 10/02/2023   HGB 15.5 10/02/2023   HCT 46.5 10/02/2023   PLT 194 10/02/2023   GLUCOSE 149 (H) 10/02/2023   CHOL 174 10/02/2023   TRIG 171 (H) 10/02/2023   HDL 45 10/02/2023   LDLCALC 99 10/02/2023   ALT 27 10/02/2023   AST 26 10/02/2023   NA 139 10/02/2023   K 3.7 10/02/2023   CL 100 10/02/2023   CREATININE 0.69 10/02/2023   BUN 12 10/02/2023   CO2 20 10/02/2023   TSH 2.570 10/02/2023   HGBA1C 6.2 (H) 06/26/2023      Assessment & Plan:  Essential hypertension, benign Assessment & Plan: Well controlled.  BP Readings from Last 3 Encounters:  10/04/23 130/70  07/12/23 130/76  06/28/23 130/78  - Continue VERAPAMIL  TO 40 MG TWICE A DAY.  - CONTINUE LISINOPRIL  10 mg by mouth once daily - Continue to work on eating a healthy diet and exercise.  Labs reviewed.      Mixed hyperlipidemia Assessment & Plan: Well controlled.  Labs reviewed.  No changes to medicines. Continue atorvastatin 10 mg 1/2 tablet daily before bed.  Continue to work on eating a healthy diet and exercise.      Prediabetes Assessment & Plan: Check A1c at next visit   Mild intermittent asthma without complication Assessment & Plan: Uses albuterol  hfa 2 puffs four times a day as needed    Acute bronchitis due to other specified organisms Assessment & Plan: Improving. If worsens or not improving, call and will send an antibiotic. Negative for covid, flu, and strep  Orders: -     POC COVID-19 BinaxNow -     POCT Influenza A/B -     POCT rapid strep A  Rhinopharyngitis Assessment & Plan: Negative for covid, flu, and strep  Orders: -     Albuterol  Sulfate HFA; Inhale 2 puffs into the lungs every 6 (six) hours as needed for wheezing or shortness of breath.  Dispense: 8 g; Refill: 1     Meds ordered this encounter  Medications   albuterol  (VENTOLIN  HFA) 108 (90 Base) MCG/ACT inhaler    Sig: Inhale 2 puffs into the lungs every 6 (six) hours as needed for wheezing or  shortness of breath.    Dispense:  8 g    Refill:  1    Orders Placed This Encounter  Procedures   POC COVID-19   POCT Influenza A/B   POCT rapid strep A     Follow-up: Return in about 6 months (around 04/05/2024) for chronic follow up.   Total time spent on today's visit was 30 minutes, including both face-to-face time and nonface-to-face time personally spent on review of chart (labs and imaging), discussing labs and goals, discussing further work-up, treatment options, referrals to specialist if needed, reviewing outside records of pertinent, answering patient's questions, and coordinating care.  An After Visit Summary was printed and given to the patient.  Abigail Free, MD [facility] [phone]

## 2023-10-04 NOTE — Assessment & Plan Note (Signed)
Check A1c at next visit.  

## 2023-10-04 NOTE — Assessment & Plan Note (Signed)
 Negative for covid, flu, and strep.

## 2023-10-04 NOTE — Assessment & Plan Note (Addendum)
 Improving. If worsens or not improving, call and will send an antibiotic. Negative for covid, flu, and strep

## 2023-10-04 NOTE — Assessment & Plan Note (Addendum)
 Well controlled.  BP Readings from Last 3 Encounters:  10/04/23 130/70  07/12/23 130/76  06/28/23 130/78  - Continue VERAPAMIL  TO 40 MG TWICE A DAY.  - CONTINUE LISINOPRIL  10 mg by mouth once daily - Continue to work on eating a healthy diet and exercise.  Labs reviewed.

## 2023-11-29 ENCOUNTER — Ambulatory Visit (INDEPENDENT_AMBULATORY_CARE_PROVIDER_SITE_OTHER)

## 2023-11-29 DIAGNOSIS — Z23 Encounter for immunization: Secondary | ICD-10-CM | POA: Diagnosis not present

## 2023-11-29 NOTE — Progress Notes (Signed)
 Patient is in office today for a nurse visit for Immunization. Patient Injection was given in the  Left deltoid. Patient tolerated injection well.

## 2023-12-31 ENCOUNTER — Other Ambulatory Visit: Payer: Self-pay | Admitting: Family Medicine

## 2024-01-04 ENCOUNTER — Other Ambulatory Visit: Payer: Self-pay | Admitting: Family Medicine

## 2024-01-24 IMAGING — MG MM DIGITAL SCREENING BILAT W/ TOMO AND CAD
8 series · 9 of 24 positions shown · non-contrast
Comparison: Previous exam(s).

CLINICAL DATA: Screening.

EXAM:
DIGITAL SCREENING BILATERAL MAMMOGRAM WITH TOMOSYNTHESIS AND CAD
TECHNIQUE: Bilateral screening digital craniocaudal and mediolateral oblique
mammograms were obtained. Bilateral screening digital breast
tomosynthesis was performed. The images were evaluated with
computer-aided detection.

[R MLO synth-2D]
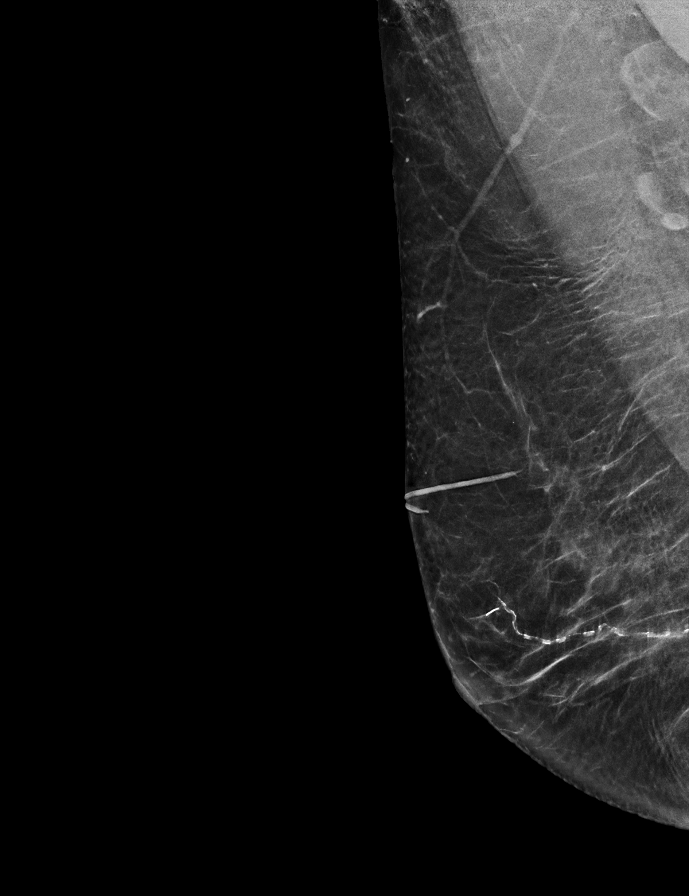

[L MLO synth-2D]
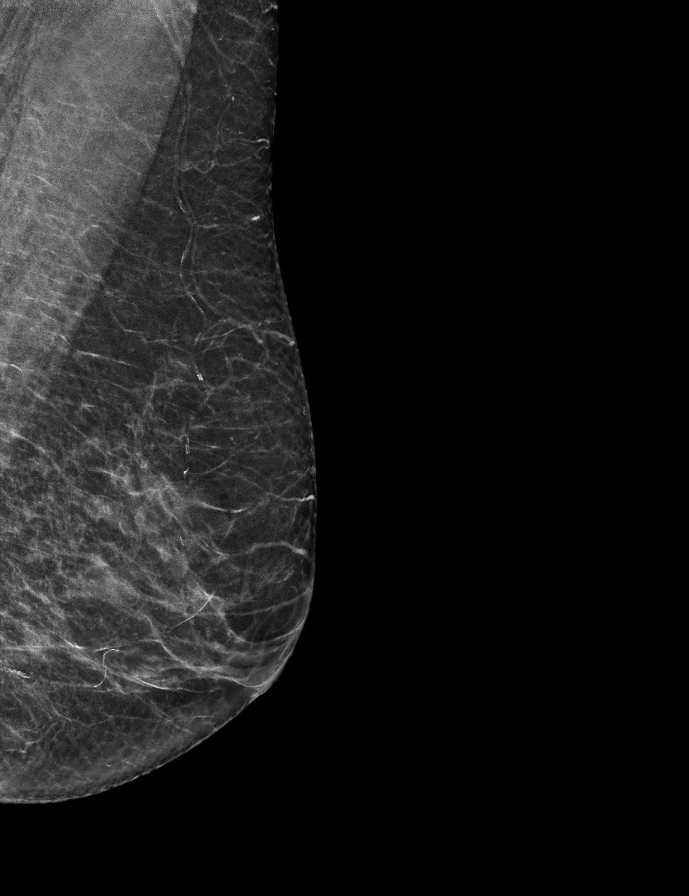

[R CC synth-2D]
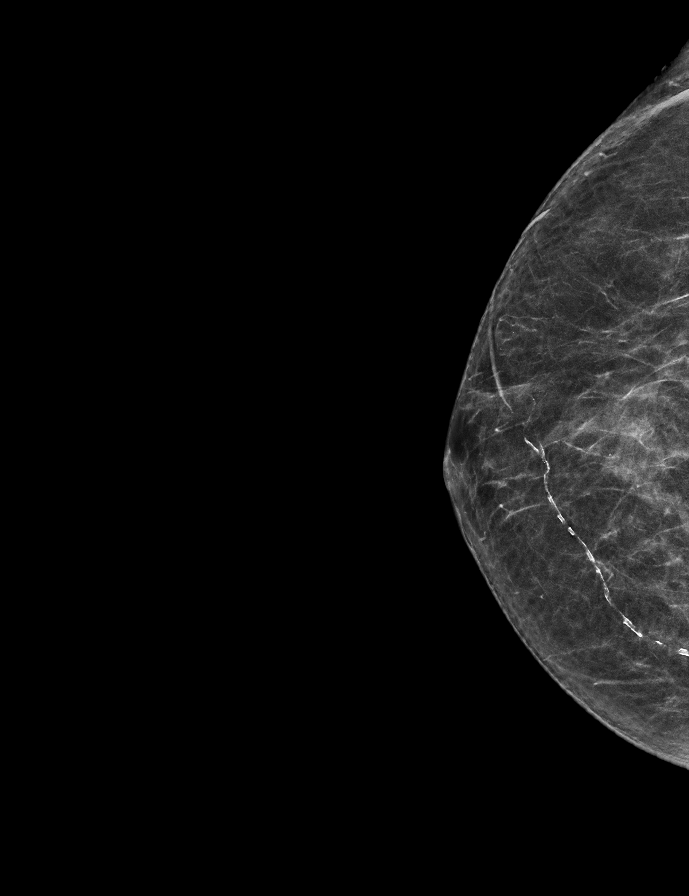

[L CC synth-2D]
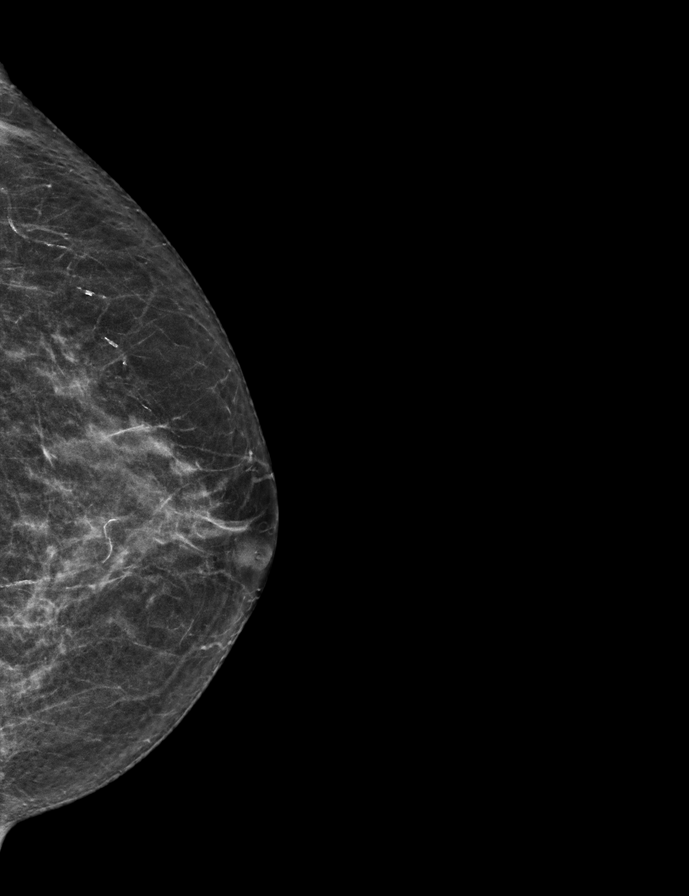

[R CC tomo · 2 of 57 frames shown]
[frame 19/57]
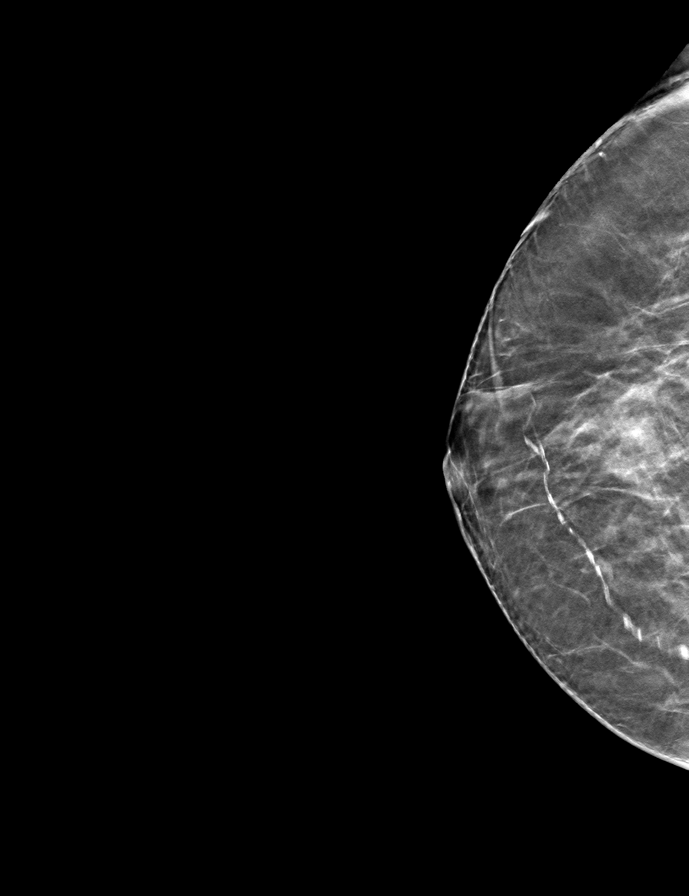
[frame 29/57]
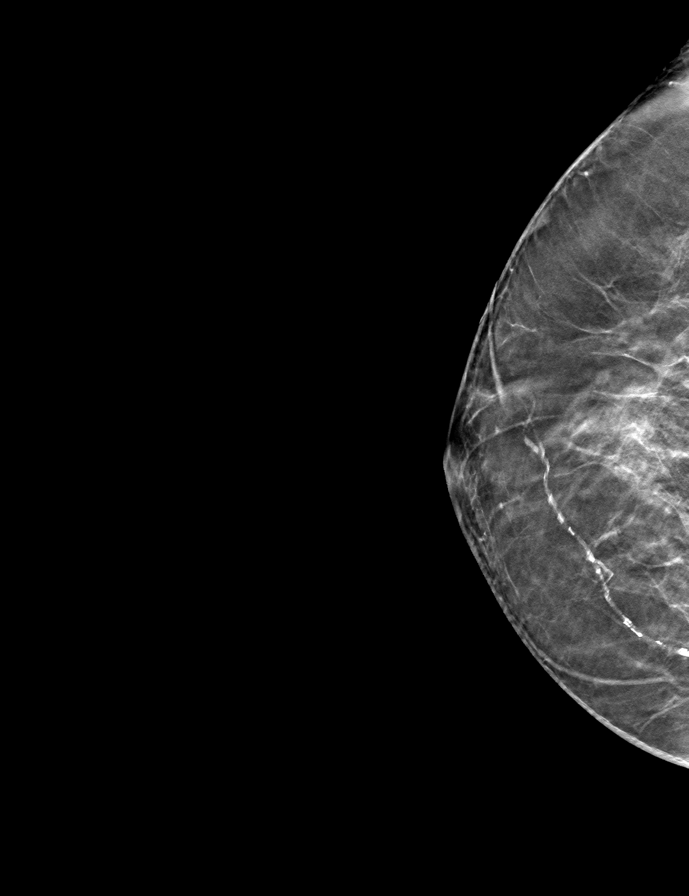

[R MLO tomo · tomo slice 31/61.0]
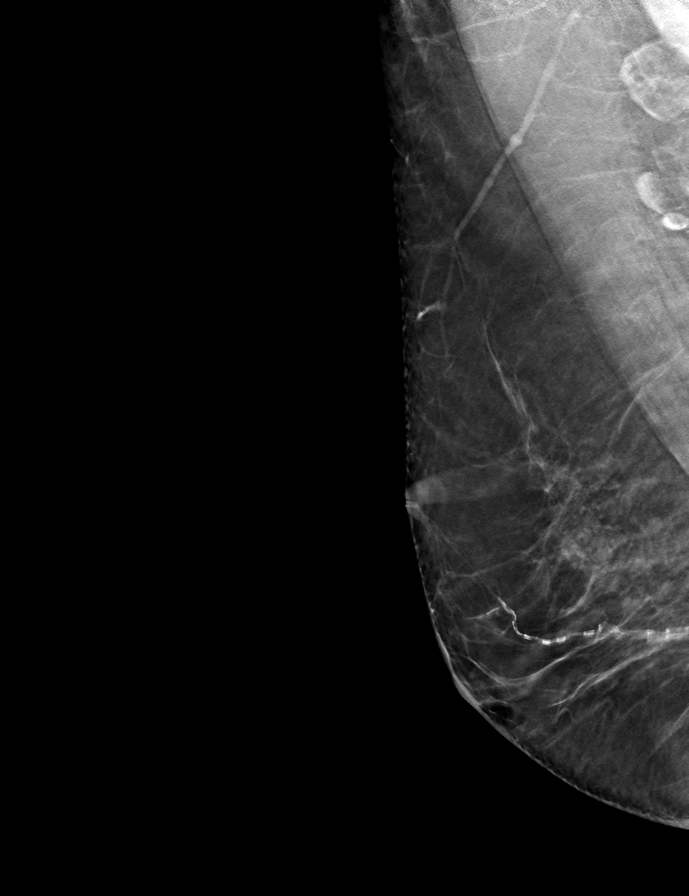

[L CC tomo · tomo slice 29/57.0]
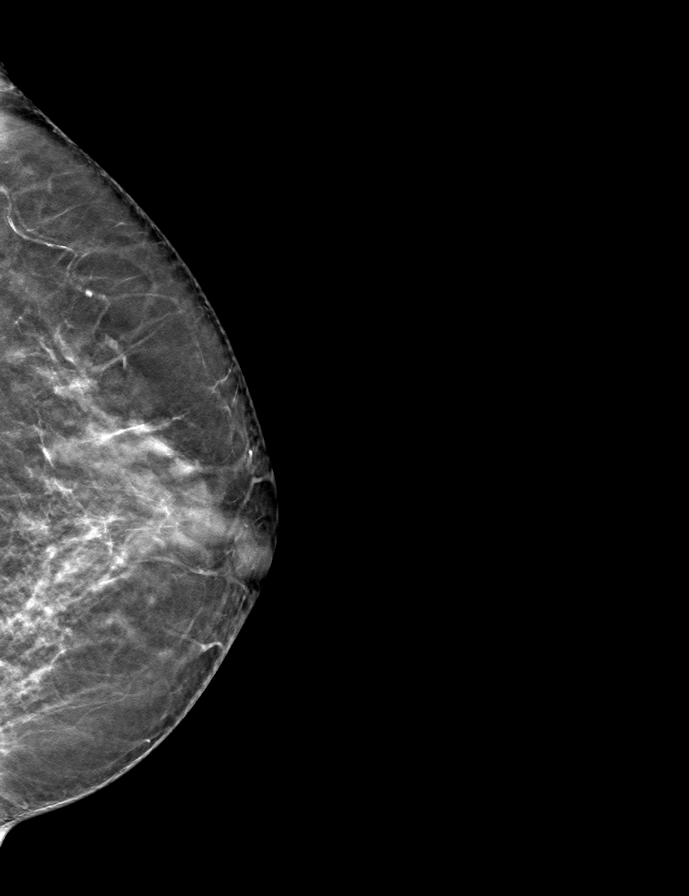

[L MLO tomo · tomo slice 29/56.0]
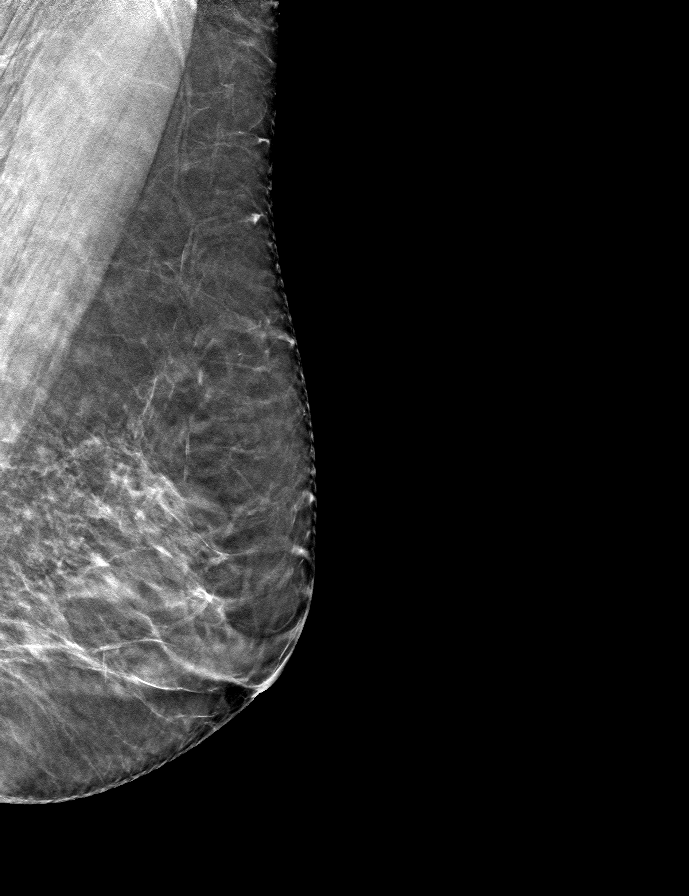

[9 of 24 positions shown; findings below may reference images not displayed]

ACR Breast Density Category c: The breast tissue is heterogeneously
dense, which may obscure small masses.
FINDINGS: There are no findings suspicious for malignancy.
IMPRESSION: No mammographic evidence of malignancy. A result letter of this
screening mammogram will be mailed directly to the patient.

RECOMMENDATION:
Screening mammogram in one year. (Code:Q3-W-BC3)

BI-RADS CATEGORY  1: Negative.

## 2024-01-28 ENCOUNTER — Other Ambulatory Visit: Payer: Self-pay | Admitting: Family Medicine

## 2024-01-28 DIAGNOSIS — R001 Bradycardia, unspecified: Secondary | ICD-10-CM

## 2024-03-07 ENCOUNTER — Other Ambulatory Visit: Payer: Self-pay | Admitting: Family Medicine

## 2024-04-03 ENCOUNTER — Other Ambulatory Visit

## 2024-04-07 ENCOUNTER — Ambulatory Visit: Admitting: Family Medicine
